# Patient Record
Sex: Female | Born: 1945 | Race: White | Hispanic: No | Marital: Married | Smoking: Former smoker
Health system: Southern US, Community
[De-identification: ages and names within clinical notes are randomized; demographics above are authoritative.]

## PROBLEM LIST (undated history)

## (undated) DIAGNOSIS — I10 Essential (primary) hypertension: Secondary | ICD-10-CM

## (undated) DIAGNOSIS — G309 Alzheimer's disease, unspecified: Secondary | ICD-10-CM

## (undated) DIAGNOSIS — J45909 Unspecified asthma, uncomplicated: Secondary | ICD-10-CM

## (undated) DIAGNOSIS — F0280 Dementia in other diseases classified elsewhere without behavioral disturbance: Secondary | ICD-10-CM

## (undated) DIAGNOSIS — F419 Anxiety disorder, unspecified: Secondary | ICD-10-CM

## (undated) HISTORY — PX: NASAL SINUS SURGERY: SHX719

## (undated) HISTORY — PX: APPENDECTOMY: SHX54

---

## 2015-11-08 ENCOUNTER — Encounter: Payer: Self-pay | Admitting: Emergency Medicine

## 2015-11-08 ENCOUNTER — Emergency Department: Payer: Medicare Other

## 2015-11-08 ENCOUNTER — Emergency Department
Admission: EM | Admit: 2015-11-08 | Discharge: 2015-11-08 | Disposition: A | Discharge status: Home / Self Care | Payer: Medicare Other | Attending: Emergency Medicine | Admitting: Emergency Medicine

## 2015-11-08 DIAGNOSIS — S80212A Abrasion, left knee, initial encounter: Secondary | ICD-10-CM | POA: Diagnosis not present

## 2015-11-08 DIAGNOSIS — T07XXXA Unspecified multiple injuries, initial encounter: Secondary | ICD-10-CM

## 2015-11-08 DIAGNOSIS — Y939 Activity, unspecified: Secondary | ICD-10-CM | POA: Diagnosis not present

## 2015-11-08 DIAGNOSIS — S62617A Displaced fracture of proximal phalanx of left little finger, initial encounter for closed fracture: Secondary | ICD-10-CM | POA: Insufficient documentation

## 2015-11-08 DIAGNOSIS — Z87891 Personal history of nicotine dependence: Secondary | ICD-10-CM | POA: Insufficient documentation

## 2015-11-08 DIAGNOSIS — S00212A Abrasion of left eyelid and periocular area, initial encounter: Secondary | ICD-10-CM | POA: Diagnosis not present

## 2015-11-08 DIAGNOSIS — S80211A Abrasion, right knee, initial encounter: Secondary | ICD-10-CM | POA: Insufficient documentation

## 2015-11-08 DIAGNOSIS — Y929 Unspecified place or not applicable: Secondary | ICD-10-CM | POA: Insufficient documentation

## 2015-11-08 DIAGNOSIS — G309 Alzheimer's disease, unspecified: Secondary | ICD-10-CM | POA: Insufficient documentation

## 2015-11-08 DIAGNOSIS — S62609A Fracture of unspecified phalanx of unspecified finger, initial encounter for closed fracture: Secondary | ICD-10-CM

## 2015-11-08 DIAGNOSIS — W010XXA Fall on same level from slipping, tripping and stumbling without subsequent striking against object, initial encounter: Secondary | ICD-10-CM | POA: Diagnosis not present

## 2015-11-08 DIAGNOSIS — S6992XA Unspecified injury of left wrist, hand and finger(s), initial encounter: Secondary | ICD-10-CM | POA: Diagnosis present

## 2015-11-08 DIAGNOSIS — Y999 Unspecified external cause status: Secondary | ICD-10-CM | POA: Insufficient documentation

## 2015-11-08 DIAGNOSIS — W19XXXA Unspecified fall, initial encounter: Secondary | ICD-10-CM

## 2015-11-08 HISTORY — DX: Dementia in other diseases classified elsewhere, unspecified severity, without behavioral disturbance, psychotic disturbance, mood disturbance, and anxiety: F02.80

## 2015-11-08 HISTORY — DX: Alzheimer's disease, unspecified: G30.9

## 2015-11-08 MED ORDER — TETANUS-DIPHTH-ACELL PERTUSSIS 5-2.5-18.5 LF-MCG/0.5 IM SUSP
0.5000 mL | Freq: Once | INTRAMUSCULAR | Status: AC
  Administered 2015-11-08: 0.5 mL via INTRAMUSCULAR
  Filled 2015-11-08: qty 0.5

## 2015-11-08 MED ORDER — LIDOCAINE HCL (PF) 1 % IJ SOLN
INTRAMUSCULAR | Status: AC
  Filled 2015-11-08: qty 5

## 2015-11-08 MED ORDER — LIDOCAINE HCL (PF) 1 % IJ SOLN
30.0000 mL | Freq: Once | INTRAMUSCULAR | Status: DC
  Filled 2015-11-08: qty 30

## 2015-11-08 MED ORDER — ACETAMINOPHEN 500 MG PO TABS
1000.0000 mg | ORAL_TABLET | Freq: Once | ORAL | Status: AC
  Administered 2015-11-08: 1000 mg via ORAL
  Filled 2015-11-08: qty 2

## 2015-11-08 NOTE — Discharge Instructions (Signed)
Finger Fracture  Fractures of fingers are breaks in the bones of the fingers. There are many types of fractures. There are different ways of treating these fractures. Your health care provider will discuss the best way to treat your fracture.  CAUSES  Traumatic injury is the main cause of broken fingers. These include:  · Injuries while playing sports.  · Workplace injuries.  · Falls.  RISK FACTORS  Activities that can increase your risk of finger fractures include:  · Sports.  · Workplace activities that involve machinery.  · A condition called osteoporosis, which can make your bones less dense and cause them to fracture more easily.  SIGNS AND SYMPTOMS  The main symptoms of a broken finger are pain and swelling within 15 minutes after the injury. Other symptoms include:  · Bruising of your finger.  · Stiffness of your finger.  · Numbness of your finger.  · Exposed bones (compound fracture) if the fracture is severe.  DIAGNOSIS   The best way to diagnose a broken bone is with X-ray imaging. Additionally, your health care provider will use this X-ray image to evaluate the position of the broken finger bones.   TREATMENT   Finger fractures can be treated with:   · Nonreduction--This means the bones are in place. The finger is splinted without changing the positions of the bone pieces. The splint is usually left on for about a week to 10 days. This will depend on your fracture and what your health care provider thinks.  · Closed reduction--The bones are put back into position without using surgery. The finger is then splinted.  · Open reduction and internal fixation--The fracture site is opened. Then the bone pieces are fixed into place with pins or some type of hardware. This is seldom required. It depends on the severity of the fracture.  HOME CARE INSTRUCTIONS   · Follow your health care provider's instructions regarding activities, exercises, and physical therapy.  · Only take over-the-counter or prescription  medicines for pain, discomfort, or fever as directed by your health care provider.  SEEK MEDICAL CARE IF:  You have pain or swelling that limits the motion or use of your fingers.  SEEK IMMEDIATE MEDICAL CARE IF:   Your finger becomes numb.  MAKE SURE YOU:   · Understand these instructions.  · Will watch your condition.  · Will get help right away if you are not doing well or get worse.     This information is not intended to replace advice given to you by your health care provider. Make sure you discuss any questions you have with your health care provider.     Document Released: 07/24/2000 Document Revised: 01/30/2013 Document Reviewed: 11/21/2012  Elsevier Interactive Patient Education ©2016 [employer]

## 2015-11-08 NOTE — ED Notes (Signed)
Patient transported to CT 

## 2015-11-08 NOTE — ED Notes (Signed)
Wounds irrigated with NS. 

## 2015-11-08 NOTE — ED Notes (Signed)
Patient presents to the ED post fall after tripping on a sidewalk.  Patient has abrasions to her knees and deformity to her fifth finger of her left hand.  Patient has small abrasion to the left side of her forehead.  Patient denies taking blood thinners.  History of alzheimer's.

## 2015-11-08 NOTE — ED Notes (Signed)
Pt verbalized understanding of discharge instructions. NAD at this time. 

## 2015-11-08 NOTE — ED Provider Notes (Signed)
[facility] Emergency Department Provider Note  ____________________________________________  Time seen: Approximately 10:49 AM  I have reviewed the triage vital signs and the nursing notes.   HISTORY  Chief Complaint Fall   HPI Jessica Webster is a 70 y.o. female with a history of Alzheimer's who presents for evaluation status post mechanical. Patient is accompanied by her husband who was with her during this accident. She tripped on the sidewalk and fell. She sustained multiple abrasions to her knees and left upper extremity. Also has deformity and swelling on the base of her fourth digit on the left hand. She has an abrasion on her forehead. According to the husband and patient no LOC. She is not on blood thinners. She has no headache. She is complaining of pain in her left hand only. She reports the pain is constant, moderate, worse with movement, present since the accident. Patient ambulatory at the scene. She has no wrist pain. No neck pain, no back pain, no abdominal pain. She denies feeling dizzy, palpitations, chest pain, shortness of breath or leading to her fall. Unknown last tetanus shot.  Past Medical History  Diagnosis Date  . Alzheimer disease     There are no active problems to display for this patient.   History reviewed. No pertinent past surgical history.  No current outpatient prescriptions on file.  Allergies Erythromycin and Sulfa antibiotics  No family history on file.  Social History Social History  Substance Use Topics  . Smoking status: Former Games developer  . Smokeless tobacco: None  . Alcohol Use: No    Review of Systems  Constitutional: Negative for fever. Eyes: Negative for visual changes. ENT: Negative for sore throat. Cardiovascular: Negative for chest pain. Respiratory: Negative for shortness of breath. Gastrointestinal: Negative for abdominal pain, vomiting or diarrhea. Genitourinary: Negative for  dysuria. Musculoskeletal: Negative for back pain. + Left hand and L shoulder pain Skin: Negative for rash. + b/l knee abrasions and forehead abrasion Neurological: Negative for headaches, weakness or numbness.  ____________________________________________   PHYSICAL EXAM:  VITAL SIGNS: ED Triage Vitals  Enc Vitals Group     BP 11/08/15 1002 106/58 mmHg     Pulse Rate 11/08/15 1002 68     Resp 11/08/15 1002 20     Temp 11/08/15 1002 97.7 F (36.5 C)     Temp Source 11/08/15 1002 Oral     SpO2 11/08/15 1002 100 %     Weight 11/08/15 1002 140 lb (63.504 kg)     Height 11/08/15 1002  (1.676 m)     Head Cir --      Peak Flow --      Pain Score 11/08/15 1005 8     Pain Loc --      Pain Edu? --      Excl. in GC? --     Constitutional: Alert and oriented. Well appearing and in no apparent distress. HEENT:      Head: Normocephalic and atraumatic.         Eyes: Conjunctivae are normal. Sclera is non-icteric. EOMI. PERRL      Mouth/Throat: Mucous membranes are moist.       Neck: Supple with no signs of meningismus. Cardiovascular: Regular rate and rhythm. No murmurs, gallops, or rubs. 2+ symmetrical distal pulses are present in all extremities. No JVD. Respiratory: Normal respiratory effort. Lungs are clear to auscultation bilaterally. No wheezes, crackles, or rhonchi.  Gastrointestinal: Soft, non tender, and non distended with positive bowel sounds.  No rebound or guarding. Genitourinary: No CVA tenderness. Musculoskeletal: Swelling and hematoma located on the base of the fourth digit on the left hand. No snuffbox tenderness, no wrist tenderness, no pain with axial load of the thumb. Tender to palpation on the anterior aspect of the left shoulder with no obvious deformities. Full painless range of motion of all other extremities including bilateral hips Neurologic: Normal speech and language. Face is symmetric. Moving all extremities. No gross focal neurologic deficits are  appreciated. Skin: Abrasion over the left eye, multiple abrasions on left upper extremity and bilateral knees  Psychiatric: Mood and affect are normal. Speech and behavior are normal.  ____________________________________________   LABS (all labs ordered are listed, but only abnormal results are displayed)  Labs Reviewed - No data to display ____________________________________________  EKG  none  ____________________________________________  RADIOLOGY  XR hand: Moderately angulated fifth proximal phalangeal fracture  CT head/ Max face / c-spine: Negative ____________________________________________   PROCEDURES  Procedure(s) performed:yes  Reduction of fracture Date/Time: 2:14 PM Performed by: Nita Sicklearolina Girlie Veltri Authorized by: Nita Sicklearolina Heinz Eckert Consent: Verbal consent obtained. Risks and benefits: risks, benefits and alternatives were discussed Consent given by: patient Required items: required blood products, implants, devices, and special equipment available Time out: Immediately prior to procedure a "time out" was called to verify the correct patient, procedure, equipment, support staff and site/side marked as required.  Patient sedated: no  Vitals: Vital signs were monitored during sedation. Patient tolerance: Patient tolerated the procedure well with no immediate complications. Bone: Left fifth proximal phalanx Reduction technique: traction, buddy tapping and aluminum splint  Good distal capillary refill   Critical Care performed:  None ____________________________________________   INITIAL IMPRESSION / ASSESSMENT AND PLAN / ED COURSE  70 y.o. female with a history of Alzheimer's who presents for evaluation status post mechanical. Patient with obvious swelling and hematoma to the left hand. Also complaining of tenderness on the anterior aspect of the left shoulder. Also with abrasion on her forehead above her left eye. No CT and L-spine tenderness, full  painless range of motion of bilateral hips area and superficial abrasion to bilateral knees. Neurologically intact. Plan for CT head, face, C-spine, x-ray of her left hand and shoulder. Tetanus booster. Tylenol for pain.  ----------------------------------------- 2:09 PM on 11/08/2015 -----------------------------------------  Head CT, CT cervical spine and maxillofacial were no injuries. Patient was found to have a fifth proximal phalanx fracture which was reduced after a digital block and buddy tapped. Will dc with referral to ortho for f/u.   Pertinent labs & imaging results that were available during my care of the patient were reviewed by me and considered in my medical decision making (see chart for details).    ____________________________________________   FINAL CLINICAL IMPRESSION(S) / ED DIAGNOSES  Final diagnoses:  Fall, initial encounter  Fracture phalanges, hand, closed, initial encounter  Abrasions of multiple sites      NEW MEDICATIONS STARTED DURING THIS VISIT:  New Prescriptions   No medications on file     Note:  This document was prepared using Dragon voice recognition software and may include unintentional dictation errors.    Nita Sicklearolina Sister Carbone, MD 11/08/15 1414

## 2016-01-25 ENCOUNTER — Other Ambulatory Visit: Payer: Self-pay | Admitting: Adult Health

## 2016-01-25 DIAGNOSIS — R1084 Generalized abdominal pain: Secondary | ICD-10-CM

## 2016-01-28 ENCOUNTER — Ambulatory Visit
Admission: RE | Admit: 2016-01-28 | Discharge: 2016-01-28 | Disposition: A | Discharge status: Home / Self Care | Payer: Medicare Other | Source: Ambulatory Visit | Attending: Adult Health | Admitting: Adult Health

## 2016-01-28 DIAGNOSIS — R1084 Generalized abdominal pain: Secondary | ICD-10-CM

## 2016-02-03 ENCOUNTER — Ambulatory Visit: Payer: Medicare Other | Attending: Neurology

## 2016-02-03 DIAGNOSIS — M6281 Muscle weakness (generalized): Secondary | ICD-10-CM | POA: Diagnosis present

## 2016-02-03 DIAGNOSIS — R2681 Unsteadiness on feet: Secondary | ICD-10-CM | POA: Diagnosis present

## 2016-02-03 DIAGNOSIS — R601 Generalized edema: Secondary | ICD-10-CM

## 2016-02-04 NOTE — Therapy (Signed)
[facility] MAIN [facility] [address] [place], [place], [address] Phone: [phone]   Fax:  [phone]  Physical Therapy Evaluation  Patient Details  Name: Jessica Webster MRN: [id number] Date of Birth: 1945/06/24 Referring Provider: Austin Miles, PA  Encounter Date: 02/03/2016      PT End of Session - 02/03/16 1538    Visit Number 1   Number of Visits 12   Date for PT Re-Evaluation 03/16/16   Authorization Type 1 / 10 (G Code)   PT Start Time 1000   PT Stop Time 1100   PT Time Calculation (min) 60 min   Equipment Utilized During Treatment Gait belt   Activity Tolerance Patient tolerated treatment well   Behavior During Therapy [facility] for tasks assessed/performed      Past Medical History:  Diagnosis Date  . Alzheimer disease     History reviewed. No pertinent surgical history.  There were no vitals filed for this visit.       Subjective Assessment - 02/03/16 1018    Subjective Pt reports imbalance with standing activities reporting difficulty with cooking, cleaning, standing for prolonged periods of times, gardening, ambulating at home, and picking items off the floor. Patient reports she has several cockerspaniel dogs that she is afraid of tripping over. Reports she's has difficulty with raising from sitting to standing and feels weak when she's performing the motion.    Pertinent History Hx of Alzheimers; Dementia    Limitations Lifting;Standing;Walking   How long can you stand comfortably? 30 min   How long can you walk comfortably? 10 min   Patient Stated Goals To feel more balanced when performing functional activities.    Currently in Pain? No/denies            [facility] PT Assessment - 02/03/16 1010      Assessment   Medical Diagnosis gait/balance Tension Headaches/Muscle Tightness    Referring Provider Austin Miles, PA   Onset Date/Surgical Date 04/26/15   Hand Dominance Right   Next MD Visit unknown    Prior Therapy none     Precautions   Precautions Fall     Balance Screen   Has the patient fallen in the past 6 months No   Has the patient had a decrease in activity level because of a fear of falling?  Yes   Is the patient reluctant to leave their home because of a fear of falling?  No     Home Tourist information centre manager residence   Living Arrangements Spouse/significant other   Available Help at Discharge Family   Type of Home House   Home Access Stairs to enter   Entrance Stairs-Number of Steps 4   Entrance Stairs-Rails Right   Home Layout Two level   Alternate Level Stairs-Number of Steps 13   Alternate Level Stairs-Rails Right   Home Equipment [place] - single point     Prior Function   Level of Independence Independent   Vocation Retired   Gaffer N/A   Leisure Taking care of children, baby sitting, play with dogs, spending time with friends     Cognition   Overall Cognitive Status No family/caregiver present to determine baseline cognitive functioning     ROM / Strength   AROM / PROM / Strength Strength     Strength   Strength Assessment Site Hip;Knee;Ankle   Right/Left Hip Right;Left   Right Hip Flexion 4/5   Right Hip ABduction 4/5  Right Hip ADduction 4/5   Left Hip Flexion 4/5   Left Hip ABduction 4/5   Left Hip ADduction 4/5   Right/Left Knee Right;Left   Right Knee Flexion 3+/5   Right Knee Extension 4/5   Left Knee Flexion 3+/5   Left Knee Extension 4/5   Right/Left Ankle Right;Left   Right Ankle Dorsiflexion 4/5   Left Ankle Dorsiflexion 4/5     Ambulation/Gait   Gait Comments Decreased step length and narrow BOS, decreased gait speed, patient with tendency to lose balance to the right requiring frequent cueing with minA-CGA      Standardized Balance Assessment   Standardized Balance Assessment Berg Balance Test   Five times sit to stand comments  25secs   10 Meter Walk .77m/s     Berg Balance Test   Sit to  Stand Able to stand  independently using hands   Standing Unsupported Able to stand safely 2 minutes   Sitting with Back Unsupported but Feet Supported on Floor or Stool Able to sit safely and securely 2 minutes   Stand to Sit Sits safely with minimal use of hands   Transfers Able to transfer safely, minor use of hands   Standing Unsupported with Eyes Closed Able to stand 10 seconds safely   Standing Ubsupported with Feet Together Able to place feet together independently and stand 1 minute safely   From Standing, Reach Forward with Outstretched Arm Can reach forward >5 cm safely (2")   From Standing Position, Pick up Object from Floor Able to pick up shoe safely and easily   From Standing Position, Turn to Look Behind Over each Shoulder Looks behind one side only/other side shows less weight shift   Turn 360 Degrees Able to turn 360 degrees safely in 4 seconds or less   Standing Unsupported, Alternately Place Feet on Step/Stool Able to complete 4 steps without aid or supervision   Standing Unsupported, One Foot in Front Able to take small step independently and hold 30 seconds   Standing on One Leg Tries to lift leg/unable to hold 3 seconds but remains standing independently   Total Score 45     Timed Up and Go Test   Normal TUG (seconds) 15       TREATMENT: Sit to stands -- 2 x 10 with cueing for proper muscular activation and joint positioning Seated abduction -- 2 x 15 with RTB with verbal and tactile cueing for hip positioning         PT Education - 02/03/16 1537    Education provided Yes   Education Details HEP: seated hip abduction with RTB, sit to stands   Person(s) Educated Patient   Methods Explanation;Demonstration   Comprehension Verbalized understanding;Returned demonstration             PT Long Term Goals - 02/04/16 1314      PT LONG TERM GOAL #1   Title Pt will score a <16 seconds when performing the 5xSTS to demonstrate significant improvement in  functional strength and decrease fall risk   Baseline 5xSTS: 25 sec   Time 6   Period Weeks   Status New     PT LONG TERM GOAL #2   Title Patient will improve her BERG score to >52 to demonstrate significant improvement in static balance and picking items off of the floor.   Baseline BERG: 46   Time 6   Period Weeks   Status New     PT LONG TERM GOAL #  3   Title Patient will be independent with HEP to conitnue benefits of exercise after discharge from physical therapy.   Baseline Dependent for exercise progression and performance   Time 6   Status New     PT LONG TERM GOAL #4   Title Patient will score under 10 secs on the TUG to demonstrate decreased fall risk and improving ability to transfer from sitting to standing   Baseline TUG: 15 sec   Time 6   Period Weeks   Status New               Plan - 02/04/16 1248    Clinical Impression Statement Pt is a 70 yo right hand dominant female experiencing with increased fall risk secondary to deconditioning. Patient presents with decreased muscular strength, endurance, and static/dynamic balancing as indicated by decreased  BERG, 5xSTS, and TUG scores and patient will benefit from further skilled therapy aimed at improving current limitations to return to prior level of function.    Rehab Potential Fair   Clinical Impairments Affecting Rehab Potential (+) Highly motivated, Family (-) Alzheimers, age   PT Frequency 2x / week   PT Duration 6 weeks   PT Treatment/Interventions Gait training;ADLs/Self Care Home Management;Moist Heat;Balance training;Therapeutic exercise;Therapeutic activities;Neuromuscular re-education;Patient/family education;Passive range of motion   PT Next Visit Plan Progress functional strengthening and balance   PT Home Exercise Plan Seated hip abduction, sit to stands   Consulted and Agree with Plan of Care Patient      Patient will benefit from skilled therapeutic intervention in order to improve the  following deficits and impairments:  Abnormal gait, Impaired sensation, Decreased strength, Decreased endurance, Decreased safety awareness, Decreased balance, Decreased coordination, Postural dysfunction, Increased muscle spasms  Visit Diagnosis: Generalized edema  Unsteadiness on feet      G-Codes - 02/04/16 1258    Functional Assessment Tool Used 5XSTS, BERG, TUG, 6751m walk test, clinical judgement   Functional Limitation Mobility: Walking and moving around   Mobility: Walking and Moving Around Current Status [phone]([id number]) At least 20 percent but less than 40 percent impaired, limited or restricted   Mobility: Walking and Moving Around Goal Status [phone]([id number]) At least 1 percent but less than 20 percent impaired, limited or restricted       Problem List There are no active problems to display for this patient.   Myrene GalasWesley Rosmary Dionisio, PT DPT 02/04/2016, 1:37 PM  [facility] [address] [place], [place], [address] Phone: [phone]   Fax:  [phone]  Name: Jessica NephewLinda Webster MRN: [id number] Date of Birth: [birth year]

## 2016-02-09 ENCOUNTER — Ambulatory Visit: Payer: Medicare Other

## 2016-02-09 DIAGNOSIS — R2681 Unsteadiness on feet: Secondary | ICD-10-CM

## 2016-02-09 DIAGNOSIS — M6281 Muscle weakness (generalized): Secondary | ICD-10-CM

## 2016-02-09 DIAGNOSIS — R601 Generalized edema: Secondary | ICD-10-CM | POA: Diagnosis not present

## 2016-02-09 NOTE — Therapy (Signed)
[facility] MAIN [facility] SERVICES [address] [place], [place], [address] Phone: [phone]   Fax:  [phone]  Physical Therapy Treatment  Patient Details  Name: Jessica Webster MRN: [id number] Date of Birth: 04-26-1945 Referring Provider: Austin Miles, PA  Encounter Date: 02/09/2016      PT End of Session - 02/09/16 1711    Visit Number 2   Number of Visits 12   Date for PT Re-Evaluation 03/16/16   Authorization Type 2 / 10 (G Code)   PT Start Time 1515   PT Stop Time 1600   PT Time Calculation (min) 45 min   Equipment Utilized During Treatment Gait belt   Activity Tolerance Patient tolerated treatment well   Behavior During Therapy [facility] for tasks assessed/performed      Past Medical History:  Diagnosis Date  . Alzheimer disease     History reviewed. No pertinent surgical history.  There were no vitals filed for this visit.      Subjective Assessment - 02/09/16 1520    Subjective Pt reports no falls in the past week and reports she's been performing exercises at home to stay fit.    Pertinent History Hx of Alzheimers; Dementia    Limitations Lifting;Standing;Walking   How long can you stand comfortably? 30 min   How long can you walk comfortably? 10 min   Patient Stated Goals To feel more balanced when performing functional activities.    Currently in Pain? No/denies      TREATMENT Therapeutic Exercise: Sit to stand - 3 x 10 with cueing on hip and knee positioning during exercise Ball Squeeze/Glute squeeze - 3 x15 with cueing on muscular activation Seated clam shells GTB - 2x 15 with cueing on hip position Seated marches with GTB around knees - 2 x 15 Feet together EO, EC - 2 x 30 secs on blue airex pad  Tandem walking in hallway - 2 x 79ft  Side stepping in hallway - 2 x 16ft Step ups onto stairs - x10 with max cueing on foot placement. Feet together EO with horizontal head/body turns, Vertical head/body turns - x  15 each side         PT Education - 02/09/16 1710    Education provided Yes   Education Details Educated on form with sit to stands   Person(s) Educated Patient   Methods Explanation;Demonstration   Comprehension Verbalized understanding;Returned demonstration             PT Long Term Goals - 02/04/16 1314      PT LONG TERM GOAL #1   Title Pt will score a <16 seconds when performing the 5xSTS to demonstrate significant improvement in functional strength and decrease fall risk   Baseline 5xSTS: 25 sec   Time 6   Period Weeks   Status New     PT LONG TERM GOAL #2   Title Patient will improve her BERG score to >52 to demonstrate significant improvement in static balance and picking items off of the floor.   Baseline BERG: 46   Time 6   Period Weeks   Status New     PT LONG TERM GOAL #3   Title Patient will be independent with HEP to conitnue benefits of exercise after discharge from physical therapy.   Baseline Dependent for exercise progression and performance   Time 6   Status New     PT LONG TERM GOAL #4   Title Patient will score under  10 secs on the TUG to demonstrate decreased fall risk and improving ability to transfer from sitting to standing   Baseline TUG: 15 sec   Time 6   Period Weeks   Status New               Plan - 02/09/16 1712    Clinical Impression Statement Pt demonstrates frequent confusion throughout session requiring constant cueing on LE placement and technique with exercise. Patient demonstrates decreased strength and coordination with exercise and will benefit from further skilled therapy to return to prior level of function.    Rehab Potential Fair   Clinical Impairments Affecting Rehab Potential (+) Highly motivated, Family (-) Alzheimers, age   PT Frequency 2x / week   PT Duration 6 weeks   PT Treatment/Interventions Gait training;ADLs/Self Care Home Management;Moist Heat;Balance training;Therapeutic exercise;Therapeutic  activities;Neuromuscular re-education;Patient/family education;Passive range of motion   PT Next Visit Plan Progress functional strengthening and balance   PT Home Exercise Plan Seated hip abduction, sit to stands   Consulted and Agree with Plan of Care Patient      Patient will benefit from skilled therapeutic intervention in order to improve the following deficits and impairments:  Abnormal gait, Impaired sensation, Decreased strength, Decreased endurance, Decreased safety awareness, Decreased balance, Decreased coordination, Postural dysfunction, Increased muscle spasms  Visit Diagnosis: Unsteadiness on feet  Muscle weakness (generalized)     Problem List There are no active problems to display for this patient.   Myrene GalasWesley Dellene Mcgroarty, PT DPT 02/09/2016, 5:15 PM  [place] [facility] [address] [place], [place], [address] Phone: [phone]   Fax:  [phone]  Name: Jessica Webster MRN: [id number] Date of Birth: 1945-05-19

## 2016-02-11 ENCOUNTER — Ambulatory Visit: Payer: Medicare Other

## 2016-02-11 DIAGNOSIS — R601 Generalized edema: Secondary | ICD-10-CM | POA: Diagnosis not present

## 2016-02-11 DIAGNOSIS — R2681 Unsteadiness on feet: Secondary | ICD-10-CM

## 2016-02-11 DIAGNOSIS — M6281 Muscle weakness (generalized): Secondary | ICD-10-CM

## 2016-02-11 NOTE — Therapy (Signed)
[facility] [address] [place], [place], [address] Phone: [phone]   Fax:  [phone]  Physical Therapy Treatment  Patient Details  Name: Jessica NephewLinda Webster MRN: [id number] Date of Birth: 1946-02-08 Referring Provider: Austin Milesynthia Lynn Dunn, PA  Encounter Date: 02/11/2016      PT End of Session - 02/11/16 1321    Visit Number 3   Number of Visits 12   Date for PT Re-Evaluation 03/16/16   Authorization Type 2 / 10 (G Code)   PT Start Time 1301   PT Stop Time 1345   PT Time Calculation (min) 44 min   Equipment Utilized During Treatment Gait belt   Activity Tolerance Patient tolerated treatment well   Behavior During Therapy [facility] for tasks assessed/performed      Past Medical History:  Diagnosis Date  . Alzheimer disease     History reviewed. No pertinent surgical history.  There were no vitals filed for this visit.      Subjective Assessment - 02/11/16 1304    Subjective Pt reports no fall since the previous visit. Patient feels she is doing well and states her balance still is giving her a little difficulty.   Pertinent History Hx of Alzheimers; Dementia    Limitations Lifting;Standing;Walking   How long can you stand comfortably? 30 min   How long can you walk comfortably? 10 min   Patient Stated Goals To feel more balanced when performing functional activities.    Currently in Pain? No/denies        TREATMENT Therapeutic Exercise: Sit to stand - 3 x 15 with cueing on hip and knee positioning during exercise Ball Squeeze/Glute squeeze - 3 x15 with cueing on muscular activation Seated clam shells GTB - 2x 15 with cueing on hip position Seated marches with GTB around knees - 2 x 15 Stand hip abduction with UE support - x 20 Standing hip extension with UE support - x 20  Leg Press machine with cueing on speed and control - 105# 2 x 20  Feet together EO, EC - 2 x 30 secs on blue airex pad  Feet together EO  with horizontal head/body turns, Vertical head/body turns on airex- x 15 each side       PT Education - 02/11/16 1317    Education provided Yes   Education Details Educated on form/technique with exercise   Person(s) Educated Patient   Methods Explanation;Demonstration   Comprehension Verbalized understanding;Returned demonstration             PT Long Term Goals - 02/04/16 1314      PT LONG TERM GOAL #1   Title Pt will score a <16 seconds when performing the 5xSTS to demonstrate significant improvement in functional strength and decrease fall risk   Baseline 5xSTS: 25 sec   Time 6   Period Weeks   Status New     PT LONG TERM GOAL #2   Title Patient will improve her BERG score to >52 to demonstrate significant improvement in static balance and picking items off of the floor.   Baseline BERG: 46   Time 6   Period Weeks   Status New     PT LONG TERM GOAL #3   Title Patient will be independent with HEP to conitnue benefits of exercise after discharge from physical therapy.   Baseline Dependent for exercise progression and performance   Time 6   Status New     PT LONG TERM  GOAL #4   Title Patient will score under 10 secs on the TUG to demonstrate decreased fall risk and improving ability to transfer from sitting to standing   Baseline TUG: 15 sec   Time 6   Period Weeks   Status New               Plan - 02/11/16 1321    Clinical Impression Statement Pt demonstrates frequent confusion throughout session requiring frequent cueing. Focused on performing standing hip and LE strengthening today as patient continues to demonstrate weakness and will benefit from further skilled therapy to return to prior level of function.    Rehab Potential Fair   Clinical Impairments Affecting Rehab Potential (+) Highly motivated, Family (-) Alzheimers, age   PT Frequency 2x / week   PT Duration 6 weeks   PT Treatment/Interventions Gait training;ADLs/Self Care Home  Management;Moist Heat;Balance training;Therapeutic exercise;Therapeutic activities;Neuromuscular re-education;Patient/family education;Passive range of motion   PT Next Visit Plan Progress functional strengthening and balance   PT Home Exercise Plan Seated hip abduction, sit to stands   Consulted and Agree with Plan of Care Patient      Patient will benefit from skilled therapeutic intervention in order to improve the following deficits and impairments:  Abnormal gait, Impaired sensation, Decreased strength, Decreased endurance, Decreased safety awareness, Decreased balance, Decreased coordination, Postural dysfunction, Increased muscle spasms  Visit Diagnosis: Unsteadiness on feet  Muscle weakness (generalized)     Problem List There are no active problems to display for this patient.   Myrene Galas, PT DPT 02/11/2016, 1:57 PM  [place] [facility] MAIN [facility] SERVICES [address] [place], [place], [address] Phone: [phone]   Fax:  [phone]  Name: Jessica Webster MRN: [id number] Date of Birth: [birth year]

## 2016-02-16 ENCOUNTER — Ambulatory Visit: Payer: Medicare Other

## 2016-02-16 DIAGNOSIS — M6281 Muscle weakness (generalized): Secondary | ICD-10-CM

## 2016-02-16 DIAGNOSIS — R601 Generalized edema: Secondary | ICD-10-CM

## 2016-02-16 DIAGNOSIS — R2681 Unsteadiness on feet: Secondary | ICD-10-CM

## 2016-02-16 NOTE — Therapy (Signed)
[place] [facility] [address] [place], [place], [address] Phone: [phone]   Fax:  [phone]  Physical Therapy Treatment  Patient Details  Name: Jessica Webster MRN: [id number] Date of Birth: [birth year] Referring Provider: Austin Milesynthia Lynn Dunn, PA  Encounter Date: 02/16/2016      PT End of Session - 02/16/16 1620    Visit Number 4   Number of Visits 12   Date for PT Re-Evaluation 03/16/16   Authorization Type 4 / 10 (G Code)   PT Start Time 1530   PT Stop Time 1615   PT Time Calculation (min) 45 min   Equipment Utilized During Treatment Gait belt   Activity Tolerance Patient tolerated treatment well   Behavior During Therapy [facility] for tasks assessed/performed      Past Medical History:  Diagnosis Date  . Alzheimer disease     History reviewed. No pertinent surgical history.  There were no vitals filed for this visit.      Subjective Assessment - 02/16/16 1531    Subjective Pt reports no falls since the previous visit and states she is not afriad of falling.    Pertinent History Hx of Alzheimers; Dementia    Limitations Lifting;Standing;Walking   How long can you stand comfortably? 30 min   How long can you walk comfortably? 10 min   Patient Stated Goals To feel more balanced when performing functional activities.    Currently in Pain? No/denies        TREATMENT Therapeutic Exercise: Sit to stand - 3 x 15 with cueing on hip and knee positioning during exercise Ball Squeeze/Glute squeeze - 2 x15 with cueing on muscular activation Seated clam shells GTB - 3 x 20 with cueing on hip position Seated marches with GTB around knees - 2 x 15 Stand hip abduction with UE support - x 20, x13  Standing hip extension with UE support - 2 x 20  Leg Press machine with cueing on speed and control - 105# 2 x 20, x10  Feet together EO with horizontal head/body turns, Vertical head/body turns on airex- x 15 each side Step ups  onto 8" step - x20 with constant cueing on technique        PT Education - 02/16/16 1619    Education provided Yes   Education Details Educated on [employer] Rxform/technique   Person(s) Educated Patient   Methods Explanation;Demonstration   Comprehension Verbalized understanding;Returned demonstration             PT Long Term Goals - 02/04/16 1314      PT LONG TERM GOAL #1   Title Pt will score a <16 seconds when performing the 5xSTS to demonstrate significant improvement in functional strength and decrease fall risk   Baseline 5xSTS: 25 sec   Time 6   Period Weeks   Status New     PT LONG TERM GOAL #2   Title Patient will improve her BERG score to >52 to demonstrate significant improvement in static balance and picking items off of the floor.   Baseline BERG: 46   Time 6   Period Weeks   Status New     PT LONG TERM GOAL #3   Title Patient will be independent with HEP to conitnue benefits of exercise after discharge from physical therapy.   Baseline Dependent for exercise progression and performance   Time 6   Status New     PT LONG TERM GOAL #4   Title Patient  will score under 10 secs on the TUG to demonstrate decreased fall risk and improving ability to transfer from sitting to standing   Baseline TUG: 15 sec   Time 6   Period Weeks   Status New               Plan - 02/16/16 1620    Clinical Impression Statement Pt demonstrates frequent confusion and requires frequent cueing throughout session. Conitnued to focus on LE strengthening and improving endurance as patient continues to demonstrate weakness transfering from sitting to standing. Patient will benefit from further skilled therapy to return to prior level of function.    Rehab Potential Fair   Clinical Impairments Affecting Rehab Potential (+) Highly motivated, Family (-) Alzheimers, age   PT Frequency 2x / week   PT Duration 6 weeks   PT Treatment/Interventions Gait training;ADLs/Self Care Home  Management;Moist Heat;Balance training;Therapeutic exercise;Therapeutic activities;Neuromuscular re-education;Patient/family education;Passive range of motion   PT Next Visit Plan Progress functional strengthening and balance   PT Home Exercise Plan Seated hip abduction, sit to stands   Consulted and Agree with Plan of Care Patient      Patient will benefit from skilled therapeutic intervention in order to improve the following deficits and impairments:  Abnormal gait, Impaired sensation, Decreased strength, Decreased endurance, Decreased safety awareness, Decreased balance, Decreased coordination, Postural dysfunction, Increased muscle spasms  Visit Diagnosis: Unsteadiness on feet  Muscle weakness (generalized)  Generalized edema     Problem List There are no active problems to display for this patient.   Jessica Webster, PT DPT 02/16/2016, 4:29 PM  [place] [facility] MAIN [facility] SERVICES [address] [place], [place], [address] Phone: [phone]   Fax:  [phone]  Name: Jessica Webster MRN: [id number] Date of Birth: August 06, 1945

## 2016-02-18 ENCOUNTER — Ambulatory Visit: Payer: Medicare Other

## 2016-02-18 VITALS — BP 95/84 | HR 89

## 2016-02-18 DIAGNOSIS — R2681 Unsteadiness on feet: Secondary | ICD-10-CM

## 2016-02-18 DIAGNOSIS — R601 Generalized edema: Secondary | ICD-10-CM | POA: Diagnosis not present

## 2016-02-18 DIAGNOSIS — M6281 Muscle weakness (generalized): Secondary | ICD-10-CM

## 2016-02-18 NOTE — Therapy (Signed)
[facility] [address] [place], [place], [address] Phone: [phone]   Fax:  [phone]  Physical Therapy Treatment  Patient Details  Name: Jessica NephewLinda Webster MRN: [id number] Date of Birth: [birth year] Referring Provider: Austin Milesynthia Lynn Dunn, PA  Encounter Date: 02/18/2016      PT End of Session - 02/18/16 1503    Visit Number 5   Number of Visits 12   Date for PT Re-Evaluation 03/16/16   Authorization Type 5 / 10 (G Code)   PT Start Time 1430   PT Stop Time 1515   PT Time Calculation (min) 45 min   Equipment Utilized During Treatment Gait belt   Activity Tolerance Patient tolerated treatment well   Behavior During Therapy [facility] for tasks assessed/performed      Past Medical History:  Diagnosis Date  . Alzheimer disease     History reviewed. No pertinent surgical history.  Vitals:   02/18/16 1434  BP: 95/84  Pulse: 89  SpO2: 100%        Subjective Assessment - 02/18/16 1434    Subjective Pt reports she is feeling "pretty good" today. Denies pain. No specific questions or concerns today.    Pertinent History Hx of Alzheimers; Dementia    Limitations Lifting;Standing;Walking   How long can you stand comfortably? 30 min   How long can you walk comfortably? 10 min   Patient Stated Goals To feel more balanced when performing functional activities.    Currently in Pain? No/denies        TREATMENT  Therapeutic Exercise: Sit to stand x 15,  Sit to stand with Airex under feet x 15; Resisted side stepping with red theraband around ankles x 6 lengths; Forward BOSU lunges alternating LE forward x 10 each; BOSU squats x 10 without UE support, round side up; Lateral BOSU lunges without UE support x 10 each side; Quantum bilateral leg press with cueing on speed and control 105# x 10, 120# x 10, 135# x 10; Seated clam shells GTB 3 x 20 with cueing on hip position; Seated marches with GTB around knees 3 x  20;                         PT Education - 02/18/16 1503    Education provided Yes   Education Details Education about form/technique with exercises today during session   Person(s) Educated Patient   Methods Explanation   Comprehension Verbalized understanding             PT Long Term Goals - 02/04/16 1314      PT LONG TERM GOAL #1   Title Pt will score a <16 seconds when performing the 5xSTS to demonstrate significant improvement in functional strength and decrease fall risk   Baseline 5xSTS: 25 sec   Time 6   Period Weeks   Status New     PT LONG TERM GOAL #2   Title Patient will improve her BERG score to >52 to demonstrate significant improvement in static balance and picking items off of the floor.   Baseline BERG: 46   Time 6   Period Weeks   Status New     PT LONG TERM GOAL #3   Title Patient will be independent with HEP to conitnue benefits of exercise after discharge from physical therapy.   Baseline Dependent for exercise progression and performance   Time 6   Status New  PT LONG TERM GOAL #4   Title Patient will score under 10 secs on the TUG to demonstrate decreased fall risk and improving ability to transfer from sitting to standing   Baseline TUG: 15 sec   Time 6   Period Weeks   Status New               Plan - 02/18/16 1503    Clinical Impression Statement Pt requiring constant redirection during session as well as heavy verbal/tactile cues for exercise technique. She requires hand over hand assistance at times to correctly perform exercises. Pt demonstrates good tolerance for exercise with minimal fatigue. Pt encouraged to follow-up as scheduled   Rehab Potential Fair   Clinical Impairments Affecting Rehab Potential (+) Highly motivated, Family (-) Alzheimers, age   PT Frequency 2x / week   PT Duration 6 weeks   PT Treatment/Interventions Gait training;ADLs/Self Care Home Management;Moist Heat;Balance  training;Therapeutic exercise;Therapeutic activities;Neuromuscular re-education;Patient/family education;Passive range of motion   PT Next Visit Plan Progress functional strengthening and balance   PT Home Exercise Plan Seated hip abduction, sit to stands   Consulted and Agree with Plan of Care Patient      Patient will benefit from skilled therapeutic intervention in order to improve the following deficits and impairments:  Abnormal gait, Impaired sensation, Decreased strength, Decreased endurance, Decreased safety awareness, Decreased balance, Decreased coordination, Postural dysfunction, Increased muscle spasms  Visit Diagnosis: Unsteadiness on feet  Muscle weakness (generalized)     Problem List There are no active problems to display for this patient.  Lynnea Maizes PT, DPT   Huprich,Jason 02/19/2016, 11:40 AM   [facility] MAIN [facility] SERVICES [address] [place], [place], [address] Phone: [phone]   Fax:  [phone]  Name: Jessica Webster MRN: [id number] Date of Birth: [birth year]

## 2016-02-25 ENCOUNTER — Ambulatory Visit: Payer: Medicare Other | Attending: Neurology

## 2016-02-25 VITALS — BP 113/77

## 2016-02-25 DIAGNOSIS — R2681 Unsteadiness on feet: Secondary | ICD-10-CM | POA: Insufficient documentation

## 2016-02-25 DIAGNOSIS — M6281 Muscle weakness (generalized): Secondary | ICD-10-CM

## 2016-02-25 NOTE — Therapy (Signed)
[place] [facility] [address] [place], [place], [address] Phone: [phone]   Fax:  [phone]  Physical Therapy Treatment  Patient Details  Name: Jessica NephewLinda Webster MRN: [id number] Date of Birth: 08-09-1945 Referring Provider: Austin Milesynthia Lynn Dunn, PA  Encounter Date: 02/25/2016      PT End of Session - 02/25/16 1654    Visit Number 6   Number of Visits 12   Date for PT Re-Evaluation 03/16/16   Authorization Type 5 / 10 (G Code)   PT Start Time 1605   PT Stop Time 1645   PT Time Calculation (min) 40 min   Equipment Utilized During Treatment Gait belt   Activity Tolerance Patient tolerated treatment well   Behavior During Therapy [facility] for tasks assessed/performed      Past Medical History:  Diagnosis Date  . Alzheimer disease     History reviewed. No pertinent surgical history.  Vitals:   02/25/16 1610  BP: 113/77        Subjective Assessment - 02/25/16 1607    Subjective Patient reports she's feeling good and states no pain or any recent falls since the previous visit.    Pertinent History Hx of Alzheimers; Dementia    Limitations Lifting;Standing;Walking   How long can you stand comfortably? 30 min   How long can you walk comfortably? 10 min   Patient Stated Goals To feel more balanced when performing functional activities.       TREATMENT   Therapeutic Exercise: Sit to stand with Airex under feet - 2 x 15; Seated clam shells GTB 2 x 20 with cueing on hip position; Seated marches with GTB around knees 2 x 15; Resisted side stepping with green theraband around ankles 4 x 6030ft lengths; Quantum bilateral leg press with cueing on speed and control  135# 3 x 15; Side stepping up and over 8" step - x 10         PT Education - 02/25/16 1653    Education provided Yes   Education Details Gave constant cueing on form/technique throughout exercise performance   Person(s) Educated Patient   Methods Explanation   Comprehension Verbalized understanding             PT Long Term Goals - 02/04/16 1314      PT LONG TERM GOAL #1   Title Pt will score a <16 seconds when performing the 5xSTS to demonstrate significant improvement in functional strength and decrease fall risk   Baseline 5xSTS: 25 sec   Time 6   Period Weeks   Status New     PT LONG TERM GOAL #2   Title Patient will improve her BERG score to >52 to demonstrate significant improvement in static balance and picking items off of the floor.   Baseline BERG: 46   Time 6   Period Weeks   Status New     PT LONG TERM GOAL #3   Title Patient will be independent with HEP to conitnue benefits of exercise after discharge from physical therapy.   Baseline Dependent for exercise progression and performance   Time 6   Status New     PT LONG TERM GOAL #4   Title Patient will score under 10 secs on the TUG to demonstrate decreased fall risk and improving ability to transfer from sitting to standing   Baseline TUG: 15 sec   Time 6   Period Weeks   Status New  Plan - 02/25/16 1655    Clinical Impression Statement Pt required frequent cueing and redirection during session requiring constrant verbal redirection throuhgout exercise session. Patient demonstrated decreased balance and postural sway during single leg stance and will benefit from further skilled therapy to return to prior level of function.    Rehab Potential Fair   Clinical Impairments Affecting Rehab Potential (+) Highly motivated, Family (-) Alzheimers, age   PT Frequency 2x / week   PT Duration 6 weeks   PT Treatment/Interventions Gait training;ADLs/Self Care Home Management;Moist Heat;Balance training;Therapeutic exercise;Therapeutic activities;Neuromuscular re-education;Patient/family education;Passive range of motion   PT Next Visit Plan Progress functional strengthening and balance   PT Home Exercise Plan Seated hip abduction, sit to stands    Consulted and Agree with Plan of Care Patient      Patient will benefit from skilled therapeutic intervention in order to improve the following deficits and impairments:  Abnormal gait, Impaired sensation, Decreased strength, Decreased endurance, Decreased safety awareness, Decreased balance, Decreased coordination, Postural dysfunction, Increased muscle spasms  Visit Diagnosis: Unsteadiness on feet  Muscle weakness (generalized)     Problem List There are no active problems to display for this patient.   Jessica Webster, PT DPT 02/25/2016, 4:59 PM  [place] [facility] [address] [place], [place], [address] Phone: [phone]   Fax:  [phone]  Name: Jessica NephewLinda Webster MRN: [id number] Date of Birth: [birth year]

## 2016-03-01 ENCOUNTER — Ambulatory Visit: Payer: Medicare Other

## 2016-03-01 DIAGNOSIS — M6281 Muscle weakness (generalized): Secondary | ICD-10-CM

## 2016-03-01 DIAGNOSIS — R2681 Unsteadiness on feet: Secondary | ICD-10-CM | POA: Diagnosis not present

## 2016-03-01 NOTE — Therapy (Signed)
[place] [facility] [address] [place], [place], [address] Phone: [phone]   Fax:  [phone]  Physical Therapy Treatment  Patient Details  Name: Jessica NephewLinda Webster MRN: [id number] Date of Birth: 11-01-1945 Referring Provider: Austin Milesynthia Lynn Dunn, PA  Encounter Date: 03/01/2016      PT End of Session - 03/01/16 1349    Visit Number 7   Number of Visits 12   Date for PT Re-Evaluation 03/16/16   Authorization Type 7 / 10 (G Code)   PT Start Time 1301   PT Stop Time 1347   PT Time Calculation (min) 46 min   Equipment Utilized During Treatment Gait belt   Activity Tolerance Patient tolerated treatment well   Behavior During Therapy [facility] for tasks assessed/performed      Past Medical History:  Diagnosis Date  . Alzheimer disease     History reviewed. No pertinent surgical history.  There were no vitals filed for this visit.      Subjective Assessment - 03/01/16 1348    Subjective Patient reports increased fatigue in her legs today and states she's had a cough. Patient reports she feels good otherwise.   Pertinent History Hx of Alzheimers; Dementia    Limitations Lifting;Standing;Walking   How long can you stand comfortably? 30 min   How long can you walk comfortably? 10 min   Patient Stated Goals To feel more balanced when performing functional activities.    Currently in Pain? No/denies      TREATMENT   Therapeutic Exercise: Sit to stand with Airex under feet - 3 x 10; Seated ball squeeze/ glute squeeze - 3 x 20  Airex beam forward tandem amb - x4 down and back; backward amb - x 3 down and back Side stepping up and over 8" step - x 12 Quantum bilateral leg press with cueing on speed and control  135# 3 x 15; Bosu taps from purple airex pad - x 20 B        PT Education - 03/01/16 1348    Education provided Yes   Education Details Frequent cueing/instruction for exercise performance and technique   Person(s)  Educated Patient   Methods Explanation;Demonstration   Comprehension Verbalized understanding;Returned demonstration             PT Long Term Goals - 02/04/16 1314      PT LONG TERM GOAL #1   Title Pt will score a <16 seconds when performing the 5xSTS to demonstrate significant improvement in functional strength and decrease fall risk   Baseline 5xSTS: 25 sec   Time 6   Period Weeks   Status New     PT LONG TERM GOAL #2   Title Patient will improve her BERG score to >52 to demonstrate significant improvement in static balance and picking items off of the floor.   Baseline BERG: 46   Time 6   Period Weeks   Status New     PT LONG TERM GOAL #3   Title Patient will be independent with HEP to conitnue benefits of exercise after discharge from physical therapy.   Baseline Dependent for exercise progression and performance   Time 6   Status New     PT LONG TERM GOAL #4   Title Patient will score under 10 secs on the TUG to demonstrate decreased fall risk and improving ability to transfer from sitting to standing   Baseline TUG: 15 sec   Time 6   Period  Weeks   Status New               Plan - 03/01/16 1350    Clinical Impression Statement Pt continues to require frequent cueing throughout treatment session to stay on task. Patient demonstrates improved single leg balance compared to previous visits indicating functional carryover. Patient will benefit from further skilled therapy to decrease fall risk.    Rehab Potential Fair   Clinical Impairments Affecting Rehab Potential (+) Highly motivated, Family (-) Alzheimers, age   PT Frequency 2x / week   PT Duration 6 weeks   PT Treatment/Interventions Gait training;ADLs/Self Care Home Management;Moist Heat;Balance training;Therapeutic exercise;Therapeutic activities;Neuromuscular re-education;Patient/family education;Passive range of motion   PT Next Visit Plan Progress functional strengthening and balance   PT Home  Exercise Plan Seated hip abduction, sit to stands   Consulted and Agree with Plan of Care Patient      Patient will benefit from skilled therapeutic intervention in order to improve the following deficits and impairments:  Abnormal gait, Impaired sensation, Decreased strength, Decreased endurance, Decreased safety awareness, Decreased balance, Decreased coordination, Postural dysfunction, Increased muscle spasms  Visit Diagnosis: Unsteadiness on feet  Muscle weakness (generalized)     Problem List There are no active problems to display for this patient.   Myrene GalasWesley Eban Weick, PT DPT 03/01/2016, 5:29 PM  [facility] [facility] [address] [place], [place], [address] Phone: [phone]   Fax:  [phone]  Name: Jessica NephewLinda Webster MRN: [id number] Date of Birth: Nov 10, 1945

## 2016-03-03 ENCOUNTER — Ambulatory Visit: Payer: Medicare Other

## 2016-03-03 DIAGNOSIS — R2681 Unsteadiness on feet: Secondary | ICD-10-CM | POA: Diagnosis not present

## 2016-03-03 DIAGNOSIS — M6281 Muscle weakness (generalized): Secondary | ICD-10-CM

## 2016-03-03 NOTE — Therapy (Signed)
[place] [facility] [address] [place], [place], [address] Phone: [phone]   Fax:  [phone]  Physical Therapy Treatment  Patient Details  Name: Jessica NephewLinda Webster MRN: [id number] Date of Birth: 09-20-1945 Referring Provider: Austin Milesynthia Lynn Dunn, PA  Encounter Date: 03/03/2016      PT End of Session - 03/03/16 1316    Visit Number 8   Number of Visits 12   Date for PT Re-Evaluation 03/16/16   Authorization Type 8 / 10 (G Code)   PT Start Time 1300   PT Stop Time 1343   PT Time Calculation (min) 43 min   Equipment Utilized During Treatment Gait belt   Activity Tolerance Patient tolerated treatment well   Behavior During Therapy [facility] for tasks assessed/performed      Past Medical History:  Diagnosis Date  . Alzheimer disease     History reviewed. No pertinent surgical history.  There were no vitals filed for this visit.      Subjective Assessment - 03/03/16 1303    Subjective Pt reports she continues to have a cough today.   Pt reports that her legs are feeling good today.     Pertinent History Hx of Alzheimers; Dementia    Limitations Lifting;Standing;Walking   How long can you stand comfortably? 30 min   How long can you walk comfortably? 10 min   Patient Stated Goals To feel more balanced when performing functional activities.    Currently in Pain? No/denies      Treatment Sit to stands with yellow weighted ball, 2 sets x 10, min VCs for cueing to count along, min VCs to squeeze gluts to stand  Sidestepping with red theraband resistance x 2 laps one lap each direction, CGA for stability, min VCs to take larger lateral steps  Seated abduction with red theraband resistance, 2 sets x 20 reps, min VCs for increased ROM  Heel raises on blue half bolster, 2 sets x 10 reps, 2HHA for security, min VCs to count out loud and for technique  Bosu lateral step ups, 2 sets x 10 reps alternating directions, 2 HHA, CGA for safety,  min VCs for bigger steps and proper exercise technique  Overhead press with #2 standing tandem on purple airex pad, 2 sets x 10 reps, min A for stability and initial positioning  Sidestepping x 2 laps on airex balance beam, 1 HHA, min VCs to increase step length Tandem walking forwards/backwards x 2 laps on airex balance beam, 1 HHA, min VCs to stand tall and perform with 1 HHA  Leg Press, BLEs, 2 sets x 15 reps, #130, min VCs for eccentric control                            PT Education - 03/03/16 1316    Education provided Yes   Education Details exercise technique, cueing to count repetitions    Person(s) Educated Patient   Methods Explanation;Demonstration;Verbal cues   Comprehension Verbalized understanding;Returned demonstration;Verbal cues required             PT Long Term Goals - 02/04/16 1314      PT LONG TERM GOAL #1   Title Pt will score a <16 seconds when performing the 5xSTS to demonstrate significant improvement in functional strength and decrease fall risk   Baseline 5xSTS: 25 sec   Time 6   Period Weeks   Status New     PT  LONG TERM GOAL #2   Title Patient will improve her BERG score to >52 to demonstrate significant improvement in static balance and picking items off of the floor.   Baseline BERG: 46   Time 6   Period Weeks   Status New     PT LONG TERM GOAL #3   Title Patient will be independent with HEP to conitnue benefits of exercise after discharge from physical therapy.   Baseline Dependent for exercise progression and performance   Time 6   Status New     PT LONG TERM GOAL #4   Title Patient will score under 10 secs on the TUG to demonstrate decreased fall risk and improving ability to transfer from sitting to standing   Baseline TUG: 15 sec   Time 6   Period Weeks   Status New               Plan - 03/03/16 1344    Clinical Impression Statement Pt continues to require cueing frequently to count exercises  repetitions and perform exercises with proper form. Pt was easily distraction today and required cueing to remain focused.  Initated further hip strengthening with sidestepping red theraband resistance and lateral steps over bosu ball.  She would benefit from further PT to increase dynamic balance for safer mobility.     Rehab Potential Fair   Clinical Impairments Affecting Rehab Potential (+) Highly motivated, Family (-) Alzheimers, age   PT Frequency 2x / week   PT Duration 6 weeks   PT Treatment/Interventions Gait training;ADLs/Self Care Home Management;Moist Heat;Balance training;Therapeutic exercise;Therapeutic activities;Neuromuscular re-education;Patient/family education;Passive range of motion   PT Next Visit Plan Progress functional strengthening and balance   PT Home Exercise Plan Seated hip abduction, sit to stands   Consulted and Agree with Plan of Care Patient      Patient will benefit from skilled therapeutic intervention in order to improve the following deficits and impairments:  Abnormal gait, Impaired sensation, Decreased strength, Decreased endurance, Decreased safety awareness, Decreased balance, Decreased coordination, Postural dysfunction, Increased muscle spasms  Visit Diagnosis: Muscle weakness (generalized)  Unsteadiness on feet     Problem List There are no active problems to display for this patient.   This entire session was performed under direct supervision and direction of a licensed therapist/therapist assistant . I have personally read, edited and approve of the note as written.  [name], SPT   Myrene GalasWesley Rissell, PT DPT 03/04/2016, 8:09 AM  [facility] MAIN [facility] [address] , [place], [address] Phone: [phone]   Fax:  [phone]  Name: Jessica NephewLinda Webster MRN: [id number] Date of Birth: 1945-09-28

## 2016-03-08 ENCOUNTER — Ambulatory Visit: Payer: Medicare Other

## 2016-03-08 DIAGNOSIS — R2681 Unsteadiness on feet: Secondary | ICD-10-CM

## 2016-03-08 DIAGNOSIS — M6281 Muscle weakness (generalized): Secondary | ICD-10-CM

## 2016-03-08 NOTE — Therapy (Signed)
[facility] [address] [place], [place], [address] Phone: [phone]   Fax:  [phone]  Physical Therapy Treatment  Patient Details  Name: Jessica NephewLinda Webster MRN: [id number] Date of Birth: 1946/03/24 Referring Provider: Austin Milesynthia Lynn Dunn, PA  Encounter Date: 03/08/2016      PT End of Session - 03/08/16 1348    Visit Number 9   Number of Visits 12   Date for PT Re-Evaluation 03/16/16   Authorization Type 9 / 10 (G Code)   PT Start Time 1333   PT Stop Time 1415   PT Time Calculation (min) 42 min   Equipment Utilized During Treatment Gait belt   Activity Tolerance Patient tolerated treatment well   Behavior During Therapy [facility] for tasks assessed/performed      Past Medical History:  Diagnosis Date  . Alzheimer disease     History reviewed. No pertinent surgical history.  There were no vitals filed for this visit.      Subjective Assessment - 03/08/16 1337    Subjective Pt reports she's feeling good today and does not have any pain.    Pertinent History Hx of Alzheimers; Dementia    Limitations Lifting;Standing;Walking   How long can you stand comfortably? 30 min   How long can you walk comfortably? 10 min   Patient Stated Goals To feel more balanced when performing functional activities.    Currently in Pain? No/denies        Treatment Sit to stands with yellow weighted ball , 2 sets x 10, min VCs for cueing to count along, min VCs to squeeze glutes to stand  Sidestepping with green  theraband resistance x 2 laps one lap each direction, CGA for stability, min VCs to take larger lateral steps  Seated abduction with green theraband resistance, 2 sets x 20 reps, min VCs for increased ROM  Seated ball/glute squeezes with yellow ball weighted ball - 2 x 15  Heel raises on blue half bolster, 2 sets x 20 reps, 2HHA for security, min VCs to count out loud and for technique  Bosu lateral step ups, 15 reps alternating  directions, 2 HHA, CGA for safety, min VCs for bigger steps and proper exercise technique  purple airex on tandem stance with #2  yellow ball standing tandem on purple airex pad, 30 min A for stability and initial positioning        PT Education - 03/08/16 1348    Education provided Yes   Education Details Educated on form and technique during exercise performance   Person(s) Educated Patient   Methods Explanation;Demonstration;Verbal cues   Comprehension Verbalized understanding;Returned demonstration             PT Long Term Goals - 02/04/16 1314      PT LONG TERM GOAL #1   Title Pt will score a <16 seconds when performing the 5xSTS to demonstrate significant improvement in functional strength and decrease fall risk   Baseline 5xSTS: 25 sec   Time 6   Period Weeks   Status New     PT LONG TERM GOAL #2   Title Patient will improve her BERG score to >52 to demonstrate significant improvement in static balance and picking items off of the floor.   Baseline BERG: 46   Time 6   Period Weeks   Status New     PT LONG TERM GOAL #3   Title Patient will be independent with HEP to conitnue benefits of  exercise after discharge from physical therapy.   Baseline Dependent for exercise progression and performance   Time 6   Status New     PT LONG TERM GOAL #4   Title Patient will score under 10 secs on the TUG to demonstrate decreased fall risk and improving ability to transfer from sitting to standing   Baseline TUG: 15 sec   Time 6   Period Weeks   Status New               Plan - 03/08/16 1424    Clinical Impression Statement Pt continues to require frequent cueing throughout treatment session on proper set up of exercises and limb/joint positioning throughout exercises. Patient demonstrates increased postural sway when performing airex pad activities indicating decreased static and dynamic balance and patient will benefit from further skilled therapy to decrease fall  risk.    Rehab Potential Fair   Clinical Impairments Affecting Rehab Potential (+) Highly motivated, Family (-) Alzheimers, age   PT Frequency 2x / week   PT Duration 6 weeks   PT Treatment/Interventions Gait training;ADLs/Self Care Home Management;Moist Heat;Balance training;Therapeutic exercise;Therapeutic activities;Neuromuscular re-education;Patient/family education;Passive range of motion   PT Next Visit Plan Progress functional strengthening and balance   PT Home Exercise Plan Seated hip abduction, sit to stands   Consulted and Agree with Plan of Care Patient      Patient will benefit from skilled therapeutic intervention in order to improve the following deficits and impairments:  Abnormal gait, Impaired sensation, Decreased strength, Decreased endurance, Decreased safety awareness, Decreased balance, Decreased coordination, Postural dysfunction, Increased muscle spasms  Visit Diagnosis: Muscle weakness (generalized)  Unsteadiness on feet     Problem List There are no active problems to display for this patient.   Jessica GalasWesley Myia Webster, PT DPT 03/08/2016, 2:28 PM  [facility] [address] [place], [place], [address] Phone: [phone]   Fax:  [phone]  Name: Jessica NephewLinda Webster MRN: [id number] Date of Birth: 1945-09-14

## 2016-03-10 ENCOUNTER — Ambulatory Visit: Payer: Medicare Other

## 2016-03-10 VITALS — BP 122/71

## 2016-03-10 DIAGNOSIS — R2681 Unsteadiness on feet: Secondary | ICD-10-CM | POA: Diagnosis not present

## 2016-03-10 DIAGNOSIS — M6281 Muscle weakness (generalized): Secondary | ICD-10-CM

## 2016-03-10 NOTE — Therapy (Signed)
[place] [facility] [address] [place], [place], [address] Phone: [phone]   Fax:  [phone]  Physical Therapy Treatment  Patient Details  Name: Jessica NephewLinda Webster MRN: [id number] Date of Birth: July 07, 1945 Referring Provider: Austin Milesynthia Lynn Dunn, PA  Encounter Date: 03/10/2016      PT End of Session - 03/10/16 1401    Visit Number 10   Number of Visits 18   Date for PT Re-Evaluation 03/31/16   Authorization Type 10/ 10 (G Code)   PT Start Time 1301   PT Stop Time 1346   PT Time Calculation (min) 45 min   Equipment Utilized During Treatment Gait belt   Activity Tolerance Patient tolerated treatment well   Behavior During Therapy [facility] for tasks assessed/performed      Past Medical History:  Diagnosis Date  . Alzheimer disease     History reviewed. No pertinent surgical history.  Vitals:   03/10/16 1306  BP: 122/71        Subjective Assessment - 03/10/16 1305    Subjective Patinet reports she's been feeling good and has not fallen since her previous visit.    Pertinent History Hx of Alzheimers; Dementia    Limitations Lifting;Standing;Walking   How long can you stand comfortably? 30 min   How long can you walk comfortably? 10 min   Patient Stated Goals To feel more balanced when performing functional activities.             [facility] Assessment - 03/10/16 1310      Standardized Balance Assessment   Five times sit to stand comments  14sec   10 Meter Walk 1.6958m/s     Berg Balance Test   Sit to Stand Able to stand without using hands and stabilize independently   Standing Unsupported Able to stand safely 2 minutes   Sitting with Back Unsupported but Feet Supported on Floor or Stool Able to sit safely and securely 2 minutes   Stand to Sit Sits safely with minimal use of hands   Transfers Able to transfer safely, minor use of hands   Standing Unsupported with Eyes Closed Able to stand 10 seconds safely   Standing Ubsupported with Feet Together Able to place feet together independently and stand 1 minute safely   From Standing, Reach Forward with Outstretched Arm Can reach forward >12 cm safely (5")   From Standing Position, Pick up Object from Floor Able to pick up shoe safely and easily   From Standing Position, Turn to Look Behind Over each Shoulder Looks behind from both sides and weight shifts well   Turn 360 Degrees Able to turn 360 degrees safely in 4 seconds or less   Standing Unsupported, Alternately Place Feet on Step/Stool Able to stand independently and complete 8 steps >20 seconds   Standing Unsupported, One Foot in Front Able to plae foot ahead of the other independently and hold 30 seconds   Standing on One Leg Able to lift leg independently and hold 5-10 seconds   Total Score 52      Treatment Sit to stands with yellow weighted ball , 2 sets x 10, min VCs for cueing to count along, min VCs to squeeze glutes to stand  Side stepping up and over 8" step - x12 with frequent cueing on hip and foot positioning Single leg stance balance - 3 x 30 sec B Tandem stance balance -- 3 x 30 sec B Heel raises on blue half bolster, 2  sets x 20 reps, 2HHA for security, min VCs to count out loud and for technique  Ball toss with feet together on airex pad - x 20  Kicking ball while standing on airex pad - x 20 bilaterally      PT Education - 03/10/16 1403    Education provided Yes   Education Details Educated on form and technique throughout exercise session   Person(s) Educated Patient   Methods Explanation;Demonstration   Comprehension Verbalized understanding;Returned demonstration             PT Long Term Goals - 03/10/16 1308      PT LONG TERM GOAL #1   Title Pt will score a <16 seconds when performing the 5xSTS to demonstrate significant improvement in functional strength and decrease fall risk   Baseline 5xSTS: 25 sec 03/10/16: 14sec   Time 6   Period Weeks   Status  Achieved     PT LONG TERM GOAL #2   Title Patient will improve her BERG score to >52 to demonstrate significant improvement in static balance and picking items off of the floor.   Baseline BERG: 46 03/10/16: 52   Time 6   Period Weeks   Status On-going     PT LONG TERM GOAL #3   Title Patient will be independent with HEP to conitnue benefits of exercise after discharge from physical therapy.   Baseline Dependent for exercise progression and performance   Time 6   Status On-going     PT LONG TERM GOAL #4   Title Patient will score under 10 secs on the TUG to demonstrate decreased fall risk and improving ability to transfer from sitting to standing   Baseline TUG: 15 sec 03/10/16: 13sec    Time 6   Period Weeks   Status On-going               Plan - 03/10/16 1405    Clinical Impression Statement Pt demonstates improved outcome measures with functional improvements in 39mwt, 5XSTS, and BERG indicating decreased fall risk and improved balance/functional strength. Although patient has improved, she continues to demonstrate increased fall risk and decreased strength and will benefit from further skilled therapy to return to prior level of function.    Rehab Potential Fair   Clinical Impairments Affecting Rehab Potential (+) Highly motivated, Family (-) Alzheimers, age   PT Frequency 2x / week   PT Duration 6 weeks   PT Treatment/Interventions Gait training;ADLs/Self Care Home Management;Moist Heat;Balance training;Therapeutic exercise;Therapeutic activities;Neuromuscular re-education;Patient/family education;Passive range of motion   PT Next Visit Plan Progress functional strengthening and balance   PT Home Exercise Plan Seated hip abduction, sit to stands   Consulted and Agree with Plan of Care Patient      Patient will benefit from skilled therapeutic intervention in order to improve the following deficits and impairments:  Abnormal gait, Impaired sensation, Decreased  strength, Decreased endurance, Decreased safety awareness, Decreased balance, Decreased coordination, Postural dysfunction, Increased muscle spasms  Visit Diagnosis: Muscle weakness (generalized) - Plan: PT plan of care cert/re-cert  Unsteadiness on feet - Plan: PT plan of care cert/re-cert       G-Codes - 03/10/16 1422    Functional Assessment Tool Used 5XSTS, BERG, TUG, 5339m walk test, clinical judgement   Functional Limitation Mobility: Walking and moving around   Mobility: Walking and Moving Around Current Status ([id number]) At least 20 percent but less than 40 percent impaired, limited or restricted   Mobility: Walking and Moving Around Goal  Status [id number]) 0 percent impaired, limited or restricted      Problem List There are no active problems to display for this patient.   Myrene Galas, PT DPT 03/10/2016, 2:24 PM  [place] [facility] MAIN [facility] SERVICES [address] [place], [place], [address] Phone: [phone]   Fax:  [phone]  Name: Jessica Webster MRN: [id number] Date of Birth: 1945/08/01

## 2016-03-15 ENCOUNTER — Ambulatory Visit: Payer: Medicare Other

## 2016-03-15 DIAGNOSIS — R2681 Unsteadiness on feet: Secondary | ICD-10-CM | POA: Diagnosis not present

## 2016-03-15 DIAGNOSIS — M6281 Muscle weakness (generalized): Secondary | ICD-10-CM

## 2016-03-15 NOTE — Therapy (Signed)
[place] [facility] [address] [place], [place], [address] Phone: [phone]   Fax:  [phone]  Physical Therapy Treatment  Patient Details  Name: Jessica NephewLinda Webster MRN: [id number] Date of Birth: [birth year] Referring Provider: Austin Milesynthia Lynn Dunn, PA  Encounter Date: 03/15/2016      PT End of Session - 03/15/16 1340    Visit Number 11   Number of Visits 18   Date for PT Re-Evaluation 03/31/16   Authorization Type 1/ 10 (G Code)   PT Start Time 1300   PT Stop Time 1345   PT Time Calculation (min) 45 min   Equipment Utilized During Treatment Gait belt   Activity Tolerance Patient tolerated treatment well   Behavior During Therapy [facility] for tasks assessed/performed      Past Medical History:  Diagnosis Date  . Alzheimer disease     History reviewed. No pertinent surgical history.  There were no vitals filed for this visit.      Subjective Assessment - 03/15/16 1309    Subjective Patient reports no new complaints since the previous visit. States she has not fallen since the visit.    Pertinent History Hx of Alzheimers; Dementia    Limitations Lifting;Standing;Walking   How long can you stand comfortably? 30 min   How long can you walk comfortably? 10 min   Patient Stated Goals To feel more balanced when performing functional activities.    Currently in Pain? No/denies      Treatment Sit to stands --  3 sets x 10, min VCs for cueing to count along, min VCs to squeeze glutes to stand  Ball squeeze/glute squeeze - 3 x 20 with max cueing on performance Leg Press on the Quantum - 3 x 20 with cueing on foot position Cone taps from airex pad w Heel raises on airex pad --  2 sets x 20 reps, 2HHA for security, min VCs to count out loud and for technique  Side stepping up and over Bosu - x12 with frequent cueing on hip,foot, and direction of movement positioning Hip abduction in standing on airex pad - 2 x 10 Step ups onto Bosu  ball with cueing on LE positioning - 2 x 10        PT Education - 03/15/16 1339    Education provided Yes   Education Details Educated on form and technique and cueing on motion throughout session   Person(s) Educated Patient   Methods Explanation;Demonstration   Comprehension Verbalized understanding;Returned demonstration             PT Long Term Goals - 03/10/16 1308      PT LONG TERM GOAL #1   Title Pt will score a <16 seconds when performing the 5xSTS to demonstrate significant improvement in functional strength and decrease fall risk   Baseline 5xSTS: 25 sec 03/10/16: 14sec   Time 6   Period Weeks   Status Achieved     PT LONG TERM GOAL #2   Title Patient will improve her BERG score to >52 to demonstrate significant improvement in static balance and picking items off of the floor.   Baseline BERG: 46 03/10/16: 52   Time 6   Period Weeks   Status On-going     PT LONG TERM GOAL #3   Title Patient will be independent with HEP to conitnue benefits of exercise after discharge from physical therapy.   Baseline Dependent for exercise progression and performance   Time 6  Status On-going     PT LONG TERM GOAL #4   Title Patient will score under 10 secs on the TUG to demonstrate decreased fall risk and improving ability to transfer from sitting to standing   Baseline TUG: 15 sec 03/10/16: 13sec    Time 6   Period Weeks   Status On-going               Plan - 03/15/16 1341    Clinical Impression Statement Pt demonstrates improved strength compared to previous visits indicating functional carryover between visits. Patient follows instruction better with cueing to count during exercise with patient maintaining on task. Patient continues to demonstrate decreased strength and balance and will benefit from further skilled therapy to return to prior level of function.    Rehab Potential Fair   Clinical Impairments Affecting Rehab Potential (+) Highly motivated,  Family (-) Alzheimers, age   PT Frequency 2x / week   PT Duration 6 weeks   PT Treatment/Interventions Gait training;ADLs/Self Care Home Management;Moist Heat;Balance training;Therapeutic exercise;Therapeutic activities;Neuromuscular re-education;Patient/family education;Passive range of motion   PT Next Visit Plan Progress functional strengthening and balance   PT Home Exercise Plan Seated hip abduction, sit to stands   Consulted and Agree with Plan of Care Patient      Patient will benefit from skilled therapeutic intervention in order to improve the following deficits and impairments:  Abnormal gait, Impaired sensation, Decreased strength, Decreased endurance, Decreased safety awareness, Decreased balance, Decreased coordination, Postural dysfunction, Increased muscle spasms  Visit Diagnosis: Unsteadiness on feet  Muscle weakness (generalized)     Problem List There are no active problems to display for this patient.   Jessica GalasWesley Rubina Webster, PT DPT 03/15/2016, 1:52 PM  [place] [facility] [address] [place], [place], [address] Phone: [phone]   Fax:  [phone]  Name: Jessica NephewLinda Webster MRN: [id number] Date of Birth: 1946/04/22

## 2016-03-22 ENCOUNTER — Ambulatory Visit: Payer: Medicare Other

## 2016-03-22 DIAGNOSIS — R2681 Unsteadiness on feet: Secondary | ICD-10-CM | POA: Diagnosis not present

## 2016-03-22 DIAGNOSIS — M6281 Muscle weakness (generalized): Secondary | ICD-10-CM

## 2016-03-22 NOTE — Therapy (Signed)
[place] [facility] [address] [place], [place], [address] Phone: [phone]   Fax:  [phone]  Physical Therapy Treatment  Patient Details  Name: Jessica NephewLinda Webster MRN: [id number] Date of Birth: 02/01/1946 Referring Provider: Austin Milesynthia Lynn Dunn, PA  Encounter Date: 03/22/2016      PT End of Session - 03/22/16 1321    Visit Number 12   Number of Visits 18   Date for PT Re-Evaluation 03/31/16   Authorization Type 2/ 10 (G Code)   PT Start Time 1301   PT Stop Time 1345   PT Time Calculation (min) 44 min   Equipment Utilized During Treatment Gait belt   Activity Tolerance Patient tolerated treatment well   Behavior During Therapy [facility] for tasks assessed/performed      Past Medical History:  Diagnosis Date  . Alzheimer disease     History reviewed. No pertinent surgical history.  There were no vitals filed for this visit.      Subjective Assessment - 03/22/16 1320    Subjective Patient returns and reports no new complaints and denies any falls since the previous visit.    Pertinent History Hx of Alzheimers; Dementia    Limitations Lifting;Standing;Walking   How long can you stand comfortably? 30 min   How long can you walk comfortably? 10 min   Patient Stated Goals To feel more balanced when performing functional activities.    Currently in Pain? No/denies       Treatment Sit to stands --  2 sets x 15, x10 (stopped early secondary to fatigue)  min VCs for cueing to count along, min VCs to squeeze glutes to stand  Ball squeeze/glute squeeze - 3 x 20 with max cueing on performance Leg Press on the Quantum - 3 x 20 with cueing on foot position 135# 4 square stepping with GTB around knees - 4 min Monster walks with GTB - 5330ft x 4 and HHA Heel raises with UE support - 2 x 20 Tandem amb with mina from PT for maintaining balance - 2 x 5130ft Single leg stance at treadmill with intermittent UE support - 2 x 1 min        PT Education - 03/22/16 1321    Education provided Yes   Education Details Eductaed on form and technique throughout therapy session   Person(s) Educated Patient   Methods Explanation;Demonstration   Comprehension Verbalized understanding;Returned demonstration             PT Long Term Goals - 03/10/16 1308      PT LONG TERM GOAL #1   Title Pt will score a <16 seconds when performing the 5xSTS to demonstrate significant improvement in functional strength and decrease fall risk   Baseline 5xSTS: 25 sec 03/10/16: 14sec   Time 6   Period Weeks   Status Achieved     PT LONG TERM GOAL #2   Title Patient will improve her BERG score to >52 to demonstrate significant improvement in static balance and picking items off of the floor.   Baseline BERG: 46 03/10/16: 52   Time 6   Period Weeks   Status On-going     PT LONG TERM GOAL #3   Title Patient will be independent with HEP to conitnue benefits of exercise after discharge from physical therapy.   Baseline Dependent for exercise progression and performance   Time 6   Status On-going     PT LONG TERM GOAL #4   Title  Patient will score under 10 secs on the TUG to demonstrate decreased fall risk and improving ability to transfer from sitting to standing   Baseline TUG: 15 sec 03/10/16: 13sec    Time 6   Period Weeks   Status On-going               Plan - 03/22/16 1322    Clinical Impression Statement Pt demonstrates improved functional strength with improved ability to perform sit to stands without assistance indicatring functional carryover between visits. Although patient is improving, she continues to demonstrate decreased muscular endurance and balance and pt will benefit from further skilled therapy to return to prior level of function.    Rehab Potential Fair   Clinical Impairments Affecting Rehab Potential (+) Highly motivated, Family (-) Alzheimers, age   PT Frequency 2x / week   PT Duration 6 weeks   PT  Treatment/Interventions Gait training;ADLs/Self Care Home Management;Moist Heat;Balance training;Therapeutic exercise;Therapeutic activities;Neuromuscular re-education;Patient/family education;Passive range of motion   PT Next Visit Plan Progress functional strengthening and balance   PT Home Exercise Plan Seated hip abduction, sit to stands   Consulted and Agree with Plan of Care Patient      Patient will benefit from skilled therapeutic intervention in order to improve the following deficits and impairments:  Abnormal gait, Impaired sensation, Decreased strength, Decreased endurance, Decreased safety awareness, Decreased balance, Decreased coordination, Postural dysfunction, Increased muscle spasms  Visit Diagnosis: Unsteadiness on feet  Muscle weakness (generalized)     Problem List There are no active problems to display for this patient.   Myrene GalasWesley Jace Fermin, PT DPT 03/22/2016, 1:46 PM  [place] [facility] [address] [place], [place], [address] Phone: [phone]   Fax:  [phone]  Name: Jessica NephewLinda Webster MRN: [id number] Date of Birth: [birth year]

## 2016-03-24 ENCOUNTER — Ambulatory Visit: Payer: Medicare Other

## 2016-03-29 ENCOUNTER — Ambulatory Visit: Payer: Medicare Other | Attending: Neurology

## 2016-03-29 DIAGNOSIS — R2681 Unsteadiness on feet: Secondary | ICD-10-CM | POA: Diagnosis not present

## 2016-03-29 DIAGNOSIS — M6281 Muscle weakness (generalized): Secondary | ICD-10-CM

## 2016-03-29 NOTE — Therapy (Signed)
[place] MAIN [facility] SERVICES [address] [place], [place], [address] Phone: [phone]   Fax:  [phone]  Physical Therapy Treatment  Patient Details  Name: Jessica Webster MRN: [id number] Date of Birth: 1946-03-05 Referring Provider: Austin Miles, PA  Encounter Date: 03/29/2016      PT End of Session - 03/29/16 1316    Visit Number 13   Number of Visits 18   Date for PT Re-Evaluation 04/26/16   Authorization Type 3/ 10 (G Code)   PT Start Time 1300   PT Stop Time 1345   PT Time Calculation (min) 45 min   Equipment Utilized During Treatment Gait belt   Activity Tolerance Patient tolerated treatment well   Behavior During Therapy [facility] for tasks assessed/performed      Past Medical History:  Diagnosis Date  . Alzheimer disease     History reviewed. No pertinent surgical history.  There were no vitals filed for this visit.      Subjective Assessment - 03/29/16 1307    Subjective Patient returns and reports no new complaints since the previous visit. Talked to husband of patient about continuing therapy and would like to continue for another month.    Pertinent History Hx of Alzheimers; Dementia    Limitations Lifting;Standing;Walking   How long can you stand comfortably? 30 min   How long can you walk comfortably? 10 min   Patient Stated Goals To feel more balanced when performing functional activities.    Currently in Pain? No/denies            [facility] PT Assessment - 03/29/16 1319      Berg Balance Test   Sit to Stand Able to stand without using hands and stabilize independently   Standing Unsupported Able to stand safely 2 minutes   Sitting with Back Unsupported but Feet Supported on Floor or Stool Able to sit safely and securely 2 minutes   Stand to Sit Sits safely with minimal use of hands   Transfers Able to transfer safely, minor use of hands   Standing Unsupported with Eyes Closed Able to stand 10 seconds  safely   Standing Ubsupported with Feet Together Able to place feet together independently and stand 1 minute safely   From Standing, Reach Forward with Outstretched Arm Can reach forward >12 cm safely (5")   From Standing Position, Pick up Object from Floor Able to pick up shoe safely and easily   From Standing Position, Turn to Look Behind Over each Shoulder Looks behind from both sides and weight shifts well   Turn 360 Degrees Able to turn 360 degrees safely in 4 seconds or less   Standing Unsupported, Alternately Place Feet on Step/Stool Able to stand independently and safely and complete 8 steps in 20 seconds   Standing Unsupported, One Foot in Front Able to place foot tandem independently and hold 30 seconds   Standing on One Leg Able to lift leg independently and hold > 10 seconds   Total Score 55     Timed Up and Go Test   Normal TUG (seconds) 13.5        Treatment Sit to stands --  2 sets x 15, x10 (stopped early secondary to fatigue)  min VCs for cueing to count along, min VCs to squeeze glutes to stand  Ball squeeze/glute squeeze - 2 x 20 with max cueing on performance Leg Press on the Quantum - x 20 with cueing on foot position 135#,  2 x 20 150# 4 square stepping with GTB around knees - 3 min Standing hip abduction - 2 x 20 B with cueing on hip position Tandem amb  from PT for maintaining balance - 2 x 7930ft Single leg stance at treadmill with intermittent UE support - 2 x 1 min        PT Education - 03/29/16 1314    Education provided Yes   Education Details Educated on counting with cueing on exercise performance throughout session   Person(s) Educated Patient   Methods Explanation;Demonstration   Comprehension Returned demonstration;Verbalized understanding             PT Long Term Goals - 03/29/16 1337      PT LONG TERM GOAL #1   Title Pt will score a <16 seconds when performing the 5xSTS to demonstrate significant improvement in functional strength and  decrease fall risk   Baseline 5xSTS: 25 sec 03/10/16: 14sec   Time 6   Period Weeks   Status Achieved     PT LONG TERM GOAL #2   Title Patient will improve her BERG score to >52 to demonstrate significant improvement in static balance and picking items off of the floor.   Baseline BERG: 46 03/10/16: 52 03/29/16: 55   Time 6   Period Weeks   Status Achieved     PT LONG TERM GOAL #3   Title Patient will be independent with HEP to conitnue benefits of exercise after discharge from physical therapy.   Baseline Dependent for exercise progression and performance 03/29/16: Requires total cueing for exercise performance   Time 6   Status On-going     PT LONG TERM GOAL #4   Title Patient will score under 10 secs on the TUG to demonstrate decreased fall risk and improving ability to transfer from sitting to standing   Baseline TUG: 15 sec 03/10/16: 13sec 03/29/16: 13.5sec    Time 6   Period Weeks   Status On-going               Plan - 03/29/16 1343    Clinical Impression Statement Pt demonstrates significant improvement in BERG balance indicating significant improvement in static balance. Patient demonstratres increased confusion today and requires increased cueing for exercise performance. Patient continues to struggle with dynamic balance and performance of exercises and teaching of exercises for home. Patient will benefit from further skilled therapy focused on improving muscular strength and dynamic balance to further decrease fall risk.    Rehab Potential Fair   Clinical Impairments Affecting Rehab Potential (+) Highly motivated, Family (-) Alzheimers, age   PT Frequency 2x / week   PT Duration 6 weeks   PT Treatment/Interventions Gait training;ADLs/Self Care Home Management;Moist Heat;Balance training;Therapeutic exercise;Therapeutic activities;Neuromuscular re-education;Patient/family education;Passive range of motion   PT Next Visit Plan Progress functional strengthening and  balance   PT Home Exercise Plan Seated hip abduction, sit to stands   Consulted and Agree with Plan of Care Patient      Patient will benefit from skilled therapeutic intervention in order to improve the following deficits and impairments:  Abnormal gait, Impaired sensation, Decreased strength, Decreased endurance, Decreased safety awareness, Decreased balance, Decreased coordination, Postural dysfunction, Increased muscle spasms  Visit Diagnosis: Unsteadiness on feet  Muscle weakness (generalized)     Problem List There are no active problems to display for this patient.   Myrene GalasWesley Cydni Reddoch, PT DPT 03/29/2016, 1:50 PM  [facility] [address] [place], [place], [address] Phone: [phone]   Fax:  [phone]  Name: Rock NephewLinda Robers MRN: [id number] Date of Birth: 1945/10/25

## 2016-04-05 ENCOUNTER — Ambulatory Visit: Payer: Medicare Other

## 2016-04-05 DIAGNOSIS — M6281 Muscle weakness (generalized): Secondary | ICD-10-CM

## 2016-04-05 DIAGNOSIS — R2681 Unsteadiness on feet: Secondary | ICD-10-CM | POA: Diagnosis not present

## 2016-04-05 NOTE — Therapy (Signed)
[place] [facility] [address] [place], [place], [address] Phone: [phone]   Fax:  [phone]  Physical Therapy Treatment  Patient Details  Name: Jessica NephewLinda Webster MRN: [id number] Date of Birth: 08-10-1945 Referring Provider: Austin Milesynthia Lynn Dunn, PA  Encounter Date: 04/05/2016      PT End of Session - 04/05/16 1411    Visit Number 14   Number of Visits 18   Date for PT Re-Evaluation 04/26/16   Authorization Type 4/ 10 (G Code)   PT Start Time 1345   PT Stop Time 1430   PT Time Calculation (min) 45 min   Equipment Utilized During Treatment Gait belt   Activity Tolerance Patient tolerated treatment well   Behavior During Therapy [facility] for tasks assessed/performed      Past Medical History:  Diagnosis Date  . Alzheimer disease     History reviewed. No pertinent surgical history.  There were no vitals filed for this visit.      Subjective Assessment - 04/05/16 1401    Subjective Patient states she has not had any falls since the previous visit session.    Pertinent History Hx of Alzheimers; Dementia    Limitations Lifting;Standing;Walking   How long can you stand comfortably? 30 min   How long can you walk comfortably? 10 min   Patient Stated Goals To feel more balanced when performing functional activities.    Currently in Pain? No/denies        Treatment Sit to stands --  2 sets x10 min VCs for cueing to count along, min VCs to squeeze glutes to stand with Black tubing  Ball squeeze/glute squeeze - 2 x 20 with max cueing on performance Leg Press on the Quantum - x 20 with cueing on foot position 135#, 2 x 20  Marches on double airex pad - 2 x 15 Side stepping up and over double airex pad - x 20  Forward stepping onto double airex pad - x 20  Standing hip abduction - 2 x 20 B with cueing on hip position       PT Education - 04/05/16 1411    Education provided Yes   Education Details Educated on form and  technique throughout session   Person(s) Educated Patient   Methods Explanation;Demonstration   Comprehension Verbalized understanding;Returned demonstration             PT Long Term Goals - 03/29/16 1337      PT LONG TERM GOAL #1   Title Pt will score a <16 seconds when performing the 5xSTS to demonstrate significant improvement in functional strength and decrease fall risk   Baseline 5xSTS: 25 sec 03/10/16: 14sec   Time 6   Period Weeks   Status Achieved     PT LONG TERM GOAL #2   Title Patient will improve her BERG score to >52 to demonstrate significant improvement in static balance and picking items off of the floor.   Baseline BERG: 46 03/10/16: 52 03/29/16: 55   Time 6   Period Weeks   Status Achieved     PT LONG TERM GOAL #3   Title Patient will be independent with HEP to conitnue benefits of exercise after discharge from physical therapy.   Baseline Dependent for exercise progression and performance 03/29/16: Requires total cueing for exercise performance   Time 6   Status On-going     PT LONG TERM GOAL #4   Title Patient will score under 10 secs on  the TUG to demonstrate decreased fall risk and improving ability to transfer from sitting to standing   Baseline TUG: 15 sec 03/10/16: 13sec 03/29/16: 13.5sec    Time 6   Period Weeks   Status On-going               Plan - 04/05/16 1431    Clinical Impression Statement Pt started more conversation today during treatment. Patient demonstrates increased fatigue with exercise performance indicating decreased muscular endurance. Patient demonstrates increased postural sway with exercise indicating decreased dynamic/static balance; patient will benefit from further skilled therapy to return to prior level of function.    Rehab Potential Fair   Clinical Impairments Affecting Rehab Potential (+) Highly motivated, Family (-) Alzheimers, age   PT Frequency 2x / week   PT Duration 6 weeks   PT Treatment/Interventions  Gait training;ADLs/Self Care Home Management;Moist Heat;Balance training;Therapeutic exercise;Therapeutic activities;Neuromuscular re-education;Patient/family education;Passive range of motion   PT Next Visit Plan Progress functional strengthening and balance   PT Home Exercise Plan Seated hip abduction, sit to stands   Consulted and Agree with Plan of Care Patient      Patient will benefit from skilled therapeutic intervention in order to improve the following deficits and impairments:  Abnormal gait, Impaired sensation, Decreased strength, Decreased endurance, Decreased safety awareness, Decreased balance, Decreased coordination, Postural dysfunction, Increased muscle spasms  Visit Diagnosis: Unsteadiness on feet  Muscle weakness (generalized)     Problem List There are no active problems to display for this patient.   Jessica Webster, PT DPT 04/05/2016, 5:22 PM  [place] [facility] [address] [place], [place], [address] Phone: [phone]   Fax:  [phone]  Name: Jessica NephewLinda Webster MRN: [id number] Date of Birth: February 13, 1946

## 2016-04-12 ENCOUNTER — Ambulatory Visit: Payer: Medicare Other

## 2016-04-12 DIAGNOSIS — R2681 Unsteadiness on feet: Secondary | ICD-10-CM | POA: Diagnosis not present

## 2016-04-12 DIAGNOSIS — M6281 Muscle weakness (generalized): Secondary | ICD-10-CM

## 2016-04-12 NOTE — Therapy (Signed)
[place] [facility] [address] [place], [place], [address] Phone: [phone]   Fax:  [phone]  Physical Therapy Treatment  Patient Details  Name: Jessica Webster MRN: [id number] Date of Birth: June 01, 1945 Referring Provider: Austin Milesynthia Lynn Dunn, PA  Encounter Date: 04/12/2016      PT End of Session - 04/12/16 1320    Visit Number 15   Number of Visits 18   Date for PT Re-Evaluation 04/26/16   Authorization Type 5/ 10 (G Code)   PT Start Time 1300   PT Stop Time 1345   PT Time Calculation (min) 45 min   Equipment Utilized During Treatment Gait belt   Activity Tolerance Patient tolerated treatment well   Behavior During Therapy [facility] for tasks assessed/performed      Past Medical History:  Diagnosis Date  . Alzheimer disease     History reviewed. No pertinent surgical history.  There were no vitals filed for this visit.      Subjective Assessment - 04/12/16 1306    Subjective Patient reports she has not had any falls since the previous visit and is feeling good today.   Pertinent History Hx of Alzheimers; Dementia    Limitations Lifting;Standing;Walking   How long can you stand comfortably? 30 min   How long can you walk comfortably? 10 min   Patient Stated Goals To feel more balanced when performing functional activities.    Currently in Pain? No/denies      Treatment Sit to stands --  2 sets x10 min VCs for cueing to count along, min VCs to squeeze glutes to stand with Black tubing  Ball squeeze/glute squeeze - 2 x 20 with max cueing on performance Leg Press on the Quantum - with cueing on foot position 135#, 3 x 15  Forward stepping onto 8" pad - x 10 Bilaterally Side stepping up and over Bosu  - x 20       PT Education - 04/12/16 1312    Education provided Yes   Education Details Form and technique throughout treatment   Person(s) Educated Patient   Methods Explanation;Demonstration   Comprehension  Verbalized understanding;Returned demonstration             PT Long Term Goals - 03/29/16 1337      PT LONG TERM GOAL #1   Title Pt will score a <16 seconds when performing the 5xSTS to demonstrate significant improvement in functional strength and decrease fall risk   Baseline 5xSTS: 25 sec 03/10/16: 14sec   Time 6   Period Weeks   Status Achieved     PT LONG TERM GOAL #2   Title Patient will improve her BERG score to >52 to demonstrate significant improvement in static balance and picking items off of the floor.   Baseline BERG: 46 03/10/16: 52 03/29/16: 55   Time 6   Period Weeks   Status Achieved     PT LONG TERM GOAL #3   Title Patient will be independent with HEP to conitnue benefits of exercise after discharge from physical therapy.   Baseline Dependent for exercise progression and performance 03/29/16: Requires total cueing for exercise performance   Time 6   Status On-going     PT LONG TERM GOAL #4   Title Patient will score under 10 secs on the TUG to demonstrate decreased fall risk and improving ability to transfer from sitting to standing   Baseline TUG: 15 sec 03/10/16: 13sec 03/29/16: 13.5sec  Time 6   Period Weeks   Status On-going               Plan - 04/12/16 1336    Clinical Impression Statement Pt required more sitting rest breaks during treatment session today and displays a cough throughout the treatment. Patient has more breathlessness throughout session and performed less exercises secondary to increased breathlessness and cough. Patient continues to demonstrate decreased strength and will benefit from further skilled therapy to return to prior level of function.    Rehab Potential Fair   Clinical Impairments Affecting Rehab Potential (+) Highly motivated, Family (-) Alzheimers, age   PT Frequency 2x / week   PT Duration 6 weeks   PT Treatment/Interventions Gait training;ADLs/Self Care Home Management;Moist Heat;Balance training;Therapeutic  exercise;Therapeutic activities;Neuromuscular re-education;Patient/family education;Passive range of motion   PT Next Visit Plan Progress functional strengthening and balance   PT Home Exercise Plan Seated hip abduction, sit to stands   Consulted and Agree with Plan of Care Patient      Patient will benefit from skilled therapeutic intervention in order to improve the following deficits and impairments:  Abnormal gait, Impaired sensation, Decreased strength, Decreased endurance, Decreased safety awareness, Decreased balance, Decreased coordination, Postural dysfunction, Increased muscle spasms  Visit Diagnosis: Unsteadiness on feet  Muscle weakness (generalized)     Problem List There are no active problems to display for this patient.   Myrene GalasWesley Zonia Caplin, PT DPT 04/12/2016, 1:56 PM  [place] [facility] [address] [place], [place], [address] Phone: [phone]   Fax:  [phone]  Name: Jessica Webster MRN: [id number] Date of Birth: 15-Jun-1945

## 2016-04-14 ENCOUNTER — Ambulatory Visit: Payer: Medicare Other

## 2016-04-14 DIAGNOSIS — R2681 Unsteadiness on feet: Secondary | ICD-10-CM

## 2016-04-14 DIAGNOSIS — M6281 Muscle weakness (generalized): Secondary | ICD-10-CM

## 2016-04-14 NOTE — Therapy (Signed)
[place] [facility] [address] [place], [place], [address] Phone: [phone]   Fax:  [phone]  Physical Therapy Treatment  Patient Details  Name: Jessica NephewLinda Webster MRN: [id number] Date of Birth: 09-Apr-1946 Referring Provider: Austin Milesynthia Lynn Dunn, PA  Encounter Date: 04/14/2016      PT End of Session - 04/14/16 1341    Visit Number 16   Number of Visits 18   Date for PT Re-Evaluation 04/26/16   Authorization Type 6/ 10 (G Code)   PT Start Time 1300   PT Stop Time 1345   PT Time Calculation (min) 45 min   Equipment Utilized During Treatment Gait belt   Activity Tolerance Patient tolerated treatment well   Behavior During Therapy [facility] for tasks assessed/performed      Past Medical History:  Diagnosis Date  . Alzheimer disease     History reviewed. No pertinent surgical history.  There were no vitals filed for this visit.      Subjective Assessment - 04/14/16 1311    Subjective Patient reports no new changes since the previous visit. Husband reports today has "not been a good day".    Patient is accompained by: Family member  husband   Pertinent History Hx of Alzheimers; Dementia    Limitations Lifting;Standing;Walking   How long can you stand comfortably? 30 min   How long can you walk comfortably? 10 min   Patient Stated Goals To feel more balanced when performing functional activities.    Currently in Pain? No/denies      Treatment Sit to stands --  2 sets x12 min VCs for cueing to count along, min VCs to squeeze glutes to  [place] DowntownBall squeeze/glute squeeze - 2 x 20 with max cueing on performance Leg Press on the Quantum - with cueing on foot position 150#, 3 x 20 Lateral walk outs at MATRIX - 7.5# x 3 back and forth Diagonal step ups onto Bosu - x 20 Bilaterally Heel raises on airex pad - 2 x 20 with UE support         PT Education - 04/14/16 1338    Education provided Yes   Education Details form and  technique throughout treatment    Person(s) Educated Patient   Methods Explanation;Demonstration   Comprehension Verbalized understanding;Returned demonstration             PT Long Term Goals - 03/29/16 1337      PT LONG TERM GOAL #1   Title Pt will score a <16 seconds when performing the 5xSTS to demonstrate significant improvement in functional strength and decrease fall risk   Baseline 5xSTS: 25 sec 03/10/16: 14sec   Time 6   Period Weeks   Status Achieved     PT LONG TERM GOAL #2   Title Patient will improve her BERG score to >52 to demonstrate significant improvement in static balance and picking items off of the floor.   Baseline BERG: 46 03/10/16: 52 03/29/16: 55   Time 6   Period Weeks   Status Achieved     PT LONG TERM GOAL #3   Title Patient will be independent with HEP to conitnue benefits of exercise after discharge from physical therapy.   Baseline Dependent for exercise progression and performance 03/29/16: Requires total cueing for exercise performance   Time 6   Status On-going     PT LONG TERM GOAL #4   Title Patient will score under 10 secs on the TUG to demonstrate  decreased fall risk and improving ability to transfer from sitting to standing   Baseline TUG: 15 sec 03/10/16: 13sec 03/29/16: 13.5sec    Time 6   Period Weeks   Status On-going               Plan - 04/14/16 1343    Clinical Impression Statement Continued to focus on strengthening the patient's LE strength to decrease fall risk. Patient continues to require frequent cueing for exercises and displaying LE weakness. Patient will benefit from further skilled therapy to return to prior level of function.    Rehab Potential Fair   Clinical Impairments Affecting Rehab Potential (+) Highly motivated, Family (-) Alzheimers, age   PT Frequency 2x / week   PT Duration 6 weeks   PT Treatment/Interventions Gait training;ADLs/Self Care Home Management;Moist Heat;Balance training;Therapeutic  exercise;Therapeutic activities;Neuromuscular re-education;Patient/family education;Passive range of motion   PT Next Visit Plan Progress functional strengthening and balance   PT Home Exercise Plan Seated hip abduction, sit to stands   Consulted and Agree with Plan of Care Patient      Patient will benefit from skilled therapeutic intervention in order to improve the following deficits and impairments:  Abnormal gait, Impaired sensation, Decreased strength, Decreased endurance, Decreased safety awareness, Decreased balance, Decreased coordination, Postural dysfunction, Increased muscle spasms  Visit Diagnosis: Unsteadiness on feet  Muscle weakness (generalized)     Problem List There are no active problems to display for this patient.   Myrene GalasWesley Sayan Aldava, PT DPT 04/14/2016, 1:54 PM  [place] [facility] [address] [place], [place], [address] Phone: [phone]   Fax:  [phone]  Name: Jessica NephewLinda Webster MRN: [id number] Date of Birth: May 20, 1945

## 2016-04-21 ENCOUNTER — Ambulatory Visit: Payer: Medicare Other

## 2016-04-21 DIAGNOSIS — M6281 Muscle weakness (generalized): Secondary | ICD-10-CM

## 2016-04-21 DIAGNOSIS — R2681 Unsteadiness on feet: Secondary | ICD-10-CM

## 2016-04-21 NOTE — Therapy (Signed)
[facility] [address] [place], [place], [address] Phone: [phone]   Fax:  [phone]  Physical Therapy Treatment  Patient Details  Name: Jessica NephewLinda Webster MRN: [id number] Date of Birth: January 11, 1946 Referring Provider: Austin Milesynthia Lynn Dunn, PA  Encounter Date: 04/21/2016      PT End of Session - 04/21/16 1329    Visit Number 17   Number of Visits 18   Date for PT Re-Evaluation 04/26/16   Authorization Type 7/ 10 (G Code)   PT Start Time 1300   PT Stop Time 1345   PT Time Calculation (min) 45 min   Equipment Utilized During Treatment Gait belt   Activity Tolerance Patient tolerated treatment well   Behavior During Therapy [facility] for tasks assessed/performed      Past Medical History:  Diagnosis Date  . Alzheimer disease     History reviewed. No pertinent surgical history.  There were no vitals filed for this visit.      Subjective Assessment - 04/21/16 1322    Subjective Patient reports no new difficulties but husband states she's had bronchitis for one week and continues with symptoms.    Patient is accompained by: Family member  husband   Pertinent History Hx of Alzheimers; Dementia    Limitations Lifting;Standing;Walking   How long can you stand comfortably? 30 min   How long can you walk comfortably? 10 min   Patient Stated Goals To feel more balanced when performing functional activities.    Currently in Pain? No/denies        Treatment Sit to stands --  3 sets x10 min VCs for cueing to count along, min VCs to squeeze glutes to  Hag[place]Ball squeeze/glute squeeze - 3 x 25 with max cueing on performance Leg Press on the Quantum - with cueing on foot position 165#, 3 x 20 Marches on airex - 2 x 10 Semi tandem balance with head turns - right/left, up/down x10 each direction Heel raises on airex pad - 2 x 20 with UE support Hip abduction on airex pad - x 20 B         PT Education - 04/21/16 1328    Education provided Yes   Education Details Speed of performance of exercises and technique   Person(s) Educated Patient   Methods Explanation;Demonstration   Comprehension Verbalized understanding;Returned demonstration             PT Long Term Goals - 03/29/16 1337      PT LONG TERM GOAL #1   Title Pt will score a <16 seconds when performing the 5xSTS to demonstrate significant improvement in functional strength and decrease fall risk   Baseline 5xSTS: 25 sec 03/10/16: 14sec   Time 6   Period Weeks   Status Achieved     PT LONG TERM GOAL #2   Title Patient will improve her BERG score to >52 to demonstrate significant improvement in static balance and picking items off of the floor.   Baseline BERG: 46 03/10/16: 52 03/29/16: 55   Time 6   Period Weeks   Status Achieved     PT LONG TERM GOAL #3   Title Patient will be independent with HEP to conitnue benefits of exercise after discharge from physical therapy.   Baseline Dependent for exercise progression and performance 03/29/16: Requires total cueing for exercise performance   Time 6   Status On-going     PT LONG TERM GOAL #4   Title Patient will  score under 10 secs on the TUG to demonstrate decreased fall risk and improving ability to transfer from sitting to standing   Baseline TUG: 15 sec 03/10/16: 13sec 03/29/16: 13.5sec    Time 6   Period Weeks   Status On-going               Plan - 04/21/16 1332    Clinical Impression Statement Patient demonstrates inccreased LE fasiculations at the end of exercise performance indicating decreased muscular strength and endurance. Performed less exercises today secondary to patient having bronchitis and monitored SpO2 throughout treatment session (patient's SpO2 did not get below 97% during entirity of treatment). Patient will benefit from further skilled therapy to return to prior level of function.    Rehab Potential Fair   Clinical Impairments Affecting Rehab Potential (+)  Highly motivated, Family (-) Alzheimers, age   PT Frequency 2x / week   PT Duration 6 weeks   PT Treatment/Interventions Gait training;ADLs/Self Care Home Management;Moist Heat;Balance training;Therapeutic exercise;Therapeutic activities;Neuromuscular re-education;Patient/family education;Passive range of motion   PT Next Visit Plan Progress functional strengthening and balance   PT Home Exercise Plan Seated hip abduction, sit to stands   Consulted and Agree with Plan of Care Patient      Patient will benefit from skilled therapeutic intervention in order to improve the following deficits and impairments:  Abnormal gait, Impaired sensation, Decreased strength, Decreased endurance, Decreased safety awareness, Decreased balance, Decreased coordination, Postural dysfunction, Increased muscle spasms  Visit Diagnosis: Unsteadiness on feet  Muscle weakness (generalized)     Problem List There are no active problems to display for this patient.   Myrene GalasWesley Aroush Chasse, PT DPT 04/21/2016, 1:56 PM  [facility] [address] [place], [place], [address] Phone: [phone]   Fax:  [phone]  Name: Jessica NephewLinda Webster MRN: [id number] Date of Birth: February 21, 1946

## 2016-04-26 ENCOUNTER — Ambulatory Visit: Payer: Medicare Other | Attending: Neurology

## 2016-04-26 DIAGNOSIS — R2681 Unsteadiness on feet: Secondary | ICD-10-CM | POA: Diagnosis not present

## 2016-04-26 DIAGNOSIS — M6281 Muscle weakness (generalized): Secondary | ICD-10-CM

## 2016-04-26 NOTE — Therapy (Signed)
[facility] [address] [place], [place], [address] Phone: [phone]   Fax:  [phone]  Physical Therapy Treatment  Patient Details  Name: Jessica Webster MRN: [id number] Date of Birth: Oct 24, 1945 Referring Provider: Austin Milesynthia Lynn Dunn, PA  Encounter Date: 04/26/2016      PT End of Session - 04/26/16 1309    Visit Number 18   Number of Visits 26   Date for PT Re-Evaluation 04/26/16   Authorization Type 8/ 10 (G Code)   PT Start Time 1301   PT Stop Time 1345   PT Time Calculation (min) 44 min   Equipment Utilized During Treatment Gait belt   Activity Tolerance Patient tolerated treatment well   Behavior During Therapy WFL for tasks assessed/performed      Past Medical History:  Diagnosis Date  . Alzheimer disease     History reviewed. No pertinent surgical history.  There were no vitals filed for this visit.      Subjective Assessment - 04/26/16 1329    Subjective Patient denies any revcent falls. Husband reports difficulty with transferring from sitting to standing. Husband reports going to see a neurologist next week.    Patient is accompained by: Family member  husband   Pertinent History Hx of Alzheimers; Dementia    Limitations Lifting;Standing;Walking   How long can you stand comfortably? 30 min   How long can you walk comfortably? 10 min   Patient Stated Goals To feel more balanced when performing functional activities.    Currently in Pain? No/denies      TUG: 10sec 5xSTS: 15sec 10mWT: 1.25 m/s   Treatment Sit to stands --  3 sets x10 min VCs for cueing to count along, min VCs to squeeze glutes to  Seated marches with GTB - 2 x 20 B Seated abduction with GTB - 2 x 20  Leg Press on the Quantum - with cueing on foot position 165#, 3 x 20 Heel raises on airex pad - 2 x 20 with UE support Seated ball squeeze - 2 x 30  Marches on airex - 2 x 12       PT Education - 04/26/16 1453    Education  provided Yes   Education Details Cueing to stay on task with exercise and outcome measures    Person(s) Educated Patient   Methods Explanation;Demonstration   Comprehension Verbalized understanding;Returned demonstration             PT Long Term Goals - 04/26/16 1315      PT LONG TERM GOAL #1   Title Pt will score a <16 seconds when performing the 5xSTS to demonstrate significant improvement in functional strength and decrease fall risk   Baseline 5xSTS: 25 sec 03/10/16: 14sec   Time 6   Period Weeks   Status Achieved     PT LONG TERM GOAL #2   Title Patient will improve her BERG score to >52 to demonstrate significant improvement in static balance and picking items off of the floor.   Baseline BERG: 46 03/10/16: 52 03/29/16: 55   Time 6   Period Weeks   Status Achieved     PT LONG TERM GOAL #3   Title Patient will be independent with HEP to conitnue benefits of exercise after discharge from physical therapy.   Baseline Dependent for exercise progression and performance 03/29/16: Requires total cueing for exercise performance; 04/26/16: Requires frequent cueing    Time 6   Status On-going  PT LONG TERM GOAL #4   Title Patient will score under 10 secs on the TUG to demonstrate decreased fall risk and improving ability to transfer from sitting to standing   Baseline TUG: 15 sec 03/10/16: 13sec 03/29/16: 13.5sec  04/26/16: 10sec   Time 6   Period Weeks   Status Achieved               Plan - 04/26/16 1407    Clinical Impression Statement Patient demonstrates singificant improvement in TUG and is able to perform in 1.55m/s indicating decreased fall risk. Although patient is improving, she continues to require frequent verbal cues for exercise performance. Patient walks with her arms crossed and is unable to self correct the pattern and requires frequent cueing on walking safely. Patient will benefit from further skilled therapy to consolidate HEP and further decrease  fall risk as patient's dementia symptoms inhibit functional carryover between visits.    Rehab Potential Fair   Clinical Impairments Affecting Rehab Potential (+) Highly motivated, Family (-) Alzheimers, age   PT Frequency 2x / week   PT Duration 6 weeks   PT Treatment/Interventions Gait training;ADLs/Self Care Home Management;Moist Heat;Balance training;Therapeutic exercise;Therapeutic activities;Neuromuscular re-education;Patient/family education;Passive range of motion   PT Next Visit Plan Progress functional strengthening and balance   PT Home Exercise Plan Seated hip abduction, sit to stands   Consulted and Agree with Plan of Care Patient      Patient will benefit from skilled therapeutic intervention in order to improve the following deficits and impairments:  Abnormal gait, Impaired sensation, Decreased strength, Decreased endurance, Decreased safety awareness, Decreased balance, Decreased coordination, Postural dysfunction, Increased muscle spasms  Visit Diagnosis: Unsteadiness on feet  Muscle weakness (generalized)     Problem List There are no active problems to display for this patient.   Myrene Galas, PT DPT 04/26/2016, 2:55 PM  [facility] MAIN [facility] SERVICES [address] [place], [place], [address] Phone: [phone]   Fax:  [phone]  Name: Jessica Webster MRN: [id number] Date of Birth: 1945-11-27

## 2016-04-28 ENCOUNTER — Ambulatory Visit: Payer: Medicare Other

## 2016-04-28 DIAGNOSIS — R2681 Unsteadiness on feet: Secondary | ICD-10-CM | POA: Diagnosis not present

## 2016-04-28 DIAGNOSIS — M6281 Muscle weakness (generalized): Secondary | ICD-10-CM

## 2016-04-28 NOTE — Therapy (Signed)
[facility] [address] [place], [place], [address] Phone: [phone]   Fax:  [phone]  Physical Therapy Treatment  Patient Details  Name: Jessica NephewLinda Webster MRN: [id number] Date of Birth: 09-16-1945 Referring Provider: Austin Milesynthia Lynn Dunn, PA  Encounter Date: 04/28/2016      PT End of Session - 04/28/16 1333    Visit Number 19   Number of Visits 26   Date for PT Re-Evaluation 05/24/16   Authorization Type 9/ 10 (G Code)   PT Start Time 1302   PT Stop Time 1345   PT Time Calculation (min) 43 min   Equipment Utilized During Treatment Gait belt   Activity Tolerance Patient tolerated treatment well   Behavior During Therapy WFL for tasks assessed/performed      Past Medical History:  Diagnosis Date  . Alzheimer disease     History reviewed. No pertinent surgical history.  There were no vitals filed for this visit.      Subjective Assessment - 04/28/16 1312    Subjective Patient reports no falls since the previous visit and states she feels "okay".   Patient is accompained by: --  husband   Pertinent History Hx of Alzheimers; Dementia    Limitations Lifting;Standing;Walking   How long can you stand comfortably? 30 min   How long can you walk comfortably? 10 min   Patient Stated Goals To feel more balanced when performing functional activities.    Currently in Pain? No/denies      Treatment Sit to stands -- 2 sets x15 min VCs for cueing to count along, min VCs to squeeze glutes with lifts with 5# bar overhead   Seated marches with GTB - 2 x 10 B Seated abduction with GTB - 2 x 20  Resisted lateral stepping at [employer] - 3 laps 7.5# B  Leg Press on the Quantum - with cueing on foot position 165#, 3 x 20 Seated ball squeeze - 2 x 20 4# Heel raises - 2 x 20 with UE support Tandem amb over airex beam - x 5 forward/backward       PT Education - 04/28/16 1331    Education provided Yes   Education Details  Form/technique with exercise   Person(s) Educated Patient   Methods Explanation;Demonstration   Comprehension Verbalized understanding;Returned demonstration             PT Long Term Goals - 04/26/16 1315      PT LONG TERM GOAL #1   Title Pt will score a <16 seconds when performing the 5xSTS to demonstrate significant improvement in functional strength and decrease fall risk   Baseline 5xSTS: 25 sec 03/10/16: 14sec   Time 6   Period Weeks   Status Achieved     PT LONG TERM GOAL #2   Title Patient will improve her BERG score to >52 to demonstrate significant improvement in static balance and picking items off of the floor.   Baseline BERG: 46 03/10/16: 52 03/29/16: 55   Time 6   Period Weeks   Status Achieved     PT LONG TERM GOAL #3   Title Patient will be independent with HEP to conitnue benefits of exercise after discharge from physical therapy.   Baseline Dependent for exercise progression and performance 03/29/16: Requires total cueing for exercise performance; 04/26/16: Requires frequent cueing    Time 6   Status On-going     PT LONG TERM GOAL #4   Title Patient will score  under 10 secs on the TUG to demonstrate decreased fall risk and improving ability to transfer from sitting to standing   Baseline TUG: 15 sec 03/10/16: 13sec 03/29/16: 13.5sec  04/26/16: 10sec   Time 6   Period Weeks   Status Achieved               Plan - 04/28/16 1335    Clinical Impression Statement Patient demonstrates ability to perform more exercises with less rest breaks today indicating functional carryover between visits and improved muscular endurance with exercise. Although patient is improving she continues with decreased dynamic/static balance and will benefit from further skilled therapy to return to prior level of function.    Rehab Potential Fair   Clinical Impairments Affecting Rehab Potential (+) Highly motivated, Family (-) Alzheimers, age   PT Frequency 2x / week   PT Duration  6 weeks   PT Treatment/Interventions Gait training;ADLs/Self Care Home Management;Moist Heat;Balance training;Therapeutic exercise;Therapeutic activities;Neuromuscular re-education;Patient/family education;Passive range of motion   PT Next Visit Plan Progress functional strengthening and balance   PT Home Exercise Plan Seated hip abduction, sit to stands   Consulted and Agree with Plan of Care Patient      Patient will benefit from skilled therapeutic intervention in order to improve the following deficits and impairments:  Abnormal gait, Impaired sensation, Decreased strength, Decreased endurance, Decreased safety awareness, Decreased balance, Decreased coordination, Postural dysfunction, Increased muscle spasms  Visit Diagnosis: Unsteadiness on feet  Muscle weakness (generalized)     Problem List There are no active problems to display for this patient.   Myrene Galas, PT DPT 04/28/2016, 1:38 PM   [facility] MAIN [facility] SERVICES [address] [place], [place], [address] Phone: [phone]   Fax:  [phone]  Name: Jessica Webster MRN: [id number] Date of Birth: 1945/06/21

## 2016-05-05 ENCOUNTER — Encounter: Payer: Self-pay | Admitting: Emergency Medicine

## 2016-05-05 ENCOUNTER — Emergency Department: Payer: Medicare Other

## 2016-05-05 ENCOUNTER — Emergency Department
Admission: EM | Admit: 2016-05-05 | Discharge: 2016-05-05 | Disposition: A | Discharge status: Home / Self Care | Payer: Medicare Other | Attending: Student in an Organized Health Care Education/Training Program | Admitting: Student in an Organized Health Care Education/Training Program

## 2016-05-05 DIAGNOSIS — Z5181 Encounter for therapeutic drug level monitoring: Secondary | ICD-10-CM | POA: Diagnosis not present

## 2016-05-05 DIAGNOSIS — R404 Transient alteration of awareness: Secondary | ICD-10-CM | POA: Insufficient documentation

## 2016-05-05 DIAGNOSIS — Z87891 Personal history of nicotine dependence: Secondary | ICD-10-CM | POA: Insufficient documentation

## 2016-05-05 DIAGNOSIS — Z79899 Other long term (current) drug therapy: Secondary | ICD-10-CM | POA: Insufficient documentation

## 2016-05-05 DIAGNOSIS — R410 Disorientation, unspecified: Secondary | ICD-10-CM | POA: Diagnosis not present

## 2016-05-05 DIAGNOSIS — G309 Alzheimer's disease, unspecified: Secondary | ICD-10-CM | POA: Insufficient documentation

## 2016-05-05 DIAGNOSIS — R4182 Altered mental status, unspecified: Secondary | ICD-10-CM | POA: Diagnosis present

## 2016-05-05 LAB — URINE DRUG SCREEN, QUALITATIVE (ARMC ONLY)
Amphetamines, Ur Screen: NOT DETECTED
Barbiturates, Ur Screen: NOT DETECTED
Benzodiazepine, Ur Scrn: NOT DETECTED
Cannabinoid 50 Ng, Ur ~~LOC~~: NOT DETECTED
Cocaine Metabolite,Ur ~~LOC~~: NOT DETECTED
MDMA (Ecstasy)Ur Screen: NOT DETECTED
Methadone Scn, Ur: NOT DETECTED
Opiate, Ur Screen: NOT DETECTED
Phencyclidine (PCP) Ur S: NOT DETECTED
Tricyclic, Ur Screen: POSITIVE — AB

## 2016-05-05 LAB — TROPONIN I

## 2016-05-05 LAB — URINALYSIS, COMPLETE (UACMP) WITH MICROSCOPIC
Bacteria, UA: NONE SEEN
Bilirubin Urine: NEGATIVE
Glucose, UA: 50 mg/dL — AB
Hgb urine dipstick: NEGATIVE
Ketones, ur: NEGATIVE mg/dL
Leukocytes, UA: NEGATIVE
Nitrite: NEGATIVE
Protein, ur: NEGATIVE mg/dL
RBC / HPF: NONE SEEN RBC/hpf (ref 0–5)
Specific Gravity, Urine: 1.023 (ref 1.005–1.030)
pH: 5 (ref 5.0–8.0)

## 2016-05-05 LAB — COMPREHENSIVE METABOLIC PANEL
ALBUMIN: 3.6 g/dL (ref 3.5–5.0)
ALK PHOS: 80 U/L (ref 38–126)
ALT: 26 U/L (ref 14–54)
ANION GAP: 7 (ref 5–15)
AST: 31 U/L (ref 15–41)
BILIRUBIN TOTAL: 0.4 mg/dL (ref 0.3–1.2)
BUN: 20 mg/dL (ref 6–20)
CALCIUM: 8.5 mg/dL — AB (ref 8.9–10.3)
CO2: 26 mmol/L (ref 22–32)
Chloride: 104 mmol/L (ref 101–111)
Creatinine, Ser: 0.78 mg/dL (ref 0.44–1.00)
GLUCOSE: 96 mg/dL (ref 65–99)
POTASSIUM: 4 mmol/L (ref 3.5–5.1)
Sodium: 137 mmol/L (ref 135–145)
TOTAL PROTEIN: 7.1 g/dL (ref 6.5–8.1)

## 2016-05-05 LAB — CBC
HCT: 32.3 % — ABNORMAL LOW (ref 35.0–47.0)
Hemoglobin: 10 g/dL — ABNORMAL LOW (ref 12.0–16.0)
MCH: 20.5 pg — ABNORMAL LOW (ref 26.0–34.0)
MCHC: 31 g/dL — ABNORMAL LOW (ref 32.0–36.0)
MCV: 66.1 fL — ABNORMAL LOW (ref 80.0–100.0)
Platelets: 199 10*3/uL (ref 150–440)
RBC: 4.89 MIL/uL (ref 3.80–5.20)
RDW: 19.1 % — ABNORMAL HIGH (ref 11.5–14.5)
WBC: 9 10*3/uL (ref 3.6–11.0)

## 2016-05-05 LAB — DIFFERENTIAL
Basophils Absolute: 0.1 10*3/uL (ref 0–0.1)
Basophils Relative: 1 %
Eosinophils Absolute: 0.4 10*3/uL (ref 0–0.7)
Eosinophils Relative: 4 %
Lymphocytes Relative: 8 %
Lymphs Abs: 0.7 10*3/uL — ABNORMAL LOW (ref 1.0–3.6)
Monocytes Absolute: 0.9 10*3/uL (ref 0.2–0.9)
Monocytes Relative: 10 %
Neutro Abs: 7 10*3/uL — ABNORMAL HIGH (ref 1.4–6.5)
Neutrophils Relative %: 77 %

## 2016-05-05 LAB — PROTIME-INR
INR: 1.03
PROTHROMBIN TIME: 13.5 s (ref 11.4–15.2)

## 2016-05-05 LAB — APTT: aPTT: 25 seconds (ref 24–36)

## 2016-05-05 LAB — GLUCOSE, CAPILLARY: Glucose-Capillary: 92 mg/dL (ref 65–99)

## 2016-05-05 MED ORDER — LEVOFLOXACIN 250 MG PO TABS: 250.0000 mg | ORAL_TABLET | Freq: Every day | ORAL | 0 refills | Status: AC

## 2016-05-05 MED ORDER — IPRATROPIUM-ALBUTEROL 0.5-2.5 (3) MG/3ML IN SOLN
3.0000 mL | Freq: Once | RESPIRATORY_TRACT | Status: AC
  Administered 2016-05-05: 3 mL via RESPIRATORY_TRACT
  Filled 2016-05-05: qty 3

## 2016-05-05 NOTE — ED Provider Notes (Signed)
[facility] Emergency Department Provider Note    None    (approximate)  I have reviewed the triage vital signs and the nursing notes.   HISTORY  Chief Complaint Altered Mental Status    HPI Jessica Webster is a 71 y.o. female with a history of Alzheimer's dementia presents with altered mental status confusion and difficulty following commands. According to the daughter the patient was at her baseline at 2:30 today she was driving around running errands with her in the car. Upon getting home and try to get her out of the car the patient was very confused and unable to follow directions as far as how to get out of the car. According to family she was favoring her left leg with a shuffling gait that they have never seen before. They state that she is still confused. Denies any recent fevers. No nausea or vomiting. No changes to any medications. No trauma.   Past Medical History:  Diagnosis Date  . Alzheimer disease    History reviewed. No pertinent family history. Past Surgical History:  Procedure Laterality Date  . APPENDECTOMY    . NASAL SINUS SURGERY     There are no active problems to display for this patient.     Prior to Admission medications   Medication Sig Start Date End Date Taking? Authorizing Provider  cholecalciferol (VITAMIN D) 1000 units tablet Take 1,000 Units by mouth daily.    Historical Provider, MD  cyclobenzaprine (FLEXERIL) 5 MG tablet Take 5 mg by mouth 3 (three) times daily as needed for muscle spasms.    Historical Provider, MD  docusate sodium (COLACE) 100 MG capsule Take 100 mg by mouth 2 (two) times daily.    Historical Provider, MD  donepezil (ARICEPT) 10 MG tablet Take 10 mg by mouth at bedtime.    Historical Provider, MD  memantine (NAMENDA) 10 MG tablet Take 10 mg by mouth 2 (two) times daily.    Historical Provider, MD  montelukast (SINGULAIR) 10 MG tablet Take 10 mg by mouth at bedtime.    Historical Provider, MD    naproxen sodium (ANAPROX) 220 MG tablet Take 220 mg by mouth 2 (two) times daily with a meal.    Historical Provider, MD  nortriptyline (PAMELOR) 10 MG capsule Take 10 mg by mouth at bedtime.    Historical Provider, MD  sertraline (ZOLOFT) 100 MG tablet Take 100 mg by mouth daily.    Historical Provider, MD    Allergies Erythromycin and Sulfa antibiotics    Social History Social History  Substance Use Topics  . Smoking status: Former Games developer  . Smokeless tobacco: Former Neurosurgeon  . Alcohol use No    Review of Systems Patient denies headaches, rhinorrhea, blurry vision, numbness, shortness of breath, chest pain, edema, cough, abdominal pain, nausea, vomiting, diarrhea, dysuria, fevers, rashes or hallucinations unless otherwise stated above in HPI. ____________________________________________   PHYSICAL EXAM:  VITAL SIGNS: Vitals:   05/05/16 1704  BP: 100/67  Pulse: 97  Resp: 18  Temp: 99.8 F (37.7 C)    Constitutional: Alert and in no acute distress. Eyes: Conjunctivae are normal. PERRL. EOMI. Head: Atraumatic. Nose: No congestion/rhinnorhea. Mouth/Throat: Mucous membranes are moist.  Oropharynx non-erythematous. Neck: No stridor. Painless ROM. No cervical spine tenderness to palpation Hematological/Lymphatic/Immunilogical: No cervical lymphadenopathy. Cardiovascular: Normal rate, regular rhythm. Grossly normal heart sounds.  Good peripheral circulation. Respiratory: Normal respiratory effort.  No retractions. Lungs CTAB. Gastrointestinal: Soft and nontender. No distention. No abdominal bruits. No CVA  tenderness. Genitourinary:  Musculoskeletal: No lower extremity tenderness nor edema.  No joint effusions. Neurologic:  Normal speech and language.  Mild expressive and receptive aphasia - likely chronic, Difficulty with one step commands  BLE cogwheel rigidity, no facial droop.  Skin:  Skin is warm, dry and intact. No rash noted. Psychiatric: Mood and affect are normal.  Speech and behavior are normal.  ____________________________________________   LABS (all labs ordered are listed, but only abnormal results are displayed)  Results for orders placed or performed during the hospital encounter of 05/05/16 (from the past 24 hour(s))  Protime-INR     Status: None   Collection Time: 05/05/16  5:47 PM  Result Value Ref Range   Prothrombin Time 13.5 11.4 - 15.2 seconds   INR 1.03   APTT     Status: None   Collection Time: 05/05/16  5:47 PM  Result Value Ref Range   aPTT 25 24 - 36 seconds  CBC     Status: Abnormal   Collection Time: 05/05/16  5:47 PM  Result Value Ref Range   WBC 9.0 3.6 - 11.0 K/uL   RBC 4.89 3.80 - 5.20 MIL/uL   Hemoglobin 10.0 (L) 12.0 - 16.0 g/dL   HCT 78.232.3 (L) 95.635.0 - 21.347.0 %   MCV 66.1 (L) 80.0 - 100.0 fL   MCH 20.5 (L) 26.0 - 34.0 pg   MCHC 31.0 (L) 32.0 - 36.0 g/dL   RDW 08.619.1 (H) 57.811.5 - 46.914.5 %   Platelets 199 150 - 440 K/uL  Differential     Status: Abnormal   Collection Time: 05/05/16  5:47 PM  Result Value Ref Range   Neutrophils Relative % 77 %   Neutro Abs 7.0 (H) 1.4 - 6.5 K/uL   Lymphocytes Relative 8 %   Lymphs Abs 0.7 (L) 1.0 - 3.6 K/uL   Monocytes Relative 10 %   Monocytes Absolute 0.9 0.2 - 0.9 K/uL   Eosinophils Relative 4 %   Eosinophils Absolute 0.4 0 - 0.7 K/uL   Basophils Relative 1 %   Basophils Absolute 0.1 0 - 0.1 K/uL  Comprehensive metabolic panel     Status: Abnormal   Collection Time: 05/05/16  5:47 PM  Result Value Ref Range   Sodium 137 135 - 145 mmol/L   Potassium 4.0 3.5 - 5.1 mmol/L   Chloride 104 101 - 111 mmol/L   CO2 26 22 - 32 mmol/L   Glucose, Bld 96 65 - 99 mg/dL   BUN 20 6 - 20 mg/dL   Creatinine, Ser 6.290.78 0.44 - 1.00 mg/dL   Calcium 8.5 (L) 8.9 - 10.3 mg/dL   Total Protein 7.1 6.5 - 8.1 g/dL   Albumin 3.6 3.5 - 5.0 g/dL   AST 31 15 - 41 U/L   ALT 26 14 - 54 U/L   Alkaline Phosphatase 80 38 - 126 U/L   Total Bilirubin 0.4 0.3 - 1.2 mg/dL   GFR calc non Af Amer >60 >60 mL/min    GFR calc Af Amer >60 >60 mL/min   Anion gap 7 5 - 15  Troponin I     Status: None   Collection Time: 05/05/16  5:47 PM  Result Value Ref Range   Troponin I <0.03 <0.03 ng/mL  Urine Drug Screen, Qualitative ([facility] only)     Status: Abnormal   Collection Time: 05/05/16  5:49 PM  Result Value Ref Range   Tricyclic, Ur Screen POSITIVE (A) NONE DETECTED   Amphetamines, Ur Screen NONE  DETECTED NONE DETECTED   MDMA (Ecstasy)Ur Screen NONE DETECTED NONE DETECTED   Cocaine Metabolite,Ur [place] NONE DETECTED NONE DETECTED   Opiate, Ur Screen NONE DETECTED NONE DETECTED   Phencyclidine (PCP) Ur S NONE DETECTED NONE DETECTED   Cannabinoid 50 Ng, Ur [place] NONE DETECTED NONE DETECTED   Barbiturates, Ur Screen NONE DETECTED NONE DETECTED   Benzodiazepine, Ur Scrn NONE DETECTED NONE DETECTED   Methadone Scn, Ur NONE DETECTED NONE DETECTED  Urinalysis, Complete w Microscopic     Status: Abnormal   Collection Time: 05/05/16  5:49 PM  Result Value Ref Range   Color, Urine YELLOW (A) YELLOW   APPearance CLEAR (A) CLEAR   Specific Gravity, Urine 1.023 1.005 - 1.030   pH 5.0 5.0 - 8.0   Glucose, UA 50 (A) NEGATIVE mg/dL   Hgb urine dipstick NEGATIVE NEGATIVE   Bilirubin Urine NEGATIVE NEGATIVE   Ketones, ur NEGATIVE NEGATIVE mg/dL   Protein, ur NEGATIVE NEGATIVE mg/dL   Nitrite NEGATIVE NEGATIVE   Leukocytes, UA NEGATIVE NEGATIVE   RBC / HPF NONE SEEN 0 - 5 RBC/hpf   WBC, UA 0-5 0 - 5 WBC/hpf   Bacteria, UA NONE SEEN NONE SEEN   Squamous Epithelial / LPF 0-5 (A) NONE SEEN   Mucous PRESENT   Glucose, capillary     Status: None   Collection Time: 05/05/16  5:55 PM  Result Value Ref Range   Glucose-Capillary 92 65 - 99 mg/dL   ____________________________________________  EKG My review and personal interpretation at Time: 17:50   Indication: ams  Rate: 90  Rhythm: sinus Axis: normal Other: non specific T wave abn ____________________________________________  RADIOLOGY  I personally reviewed  all radiographic images ordered to evaluate for the above acute complaints and reviewed radiology reports and findings.  These findings were personally discussed with the patient.  Please see medical record for radiology report. ____________________________________________   PROCEDURES  Procedure(s) performed:  Procedures    Critical Care performed: no ____________________________________________   INITIAL IMPRESSION / ASSESSMENT AND PLAN / ED COURSE  Pertinent labs & imaging results that were available during my care of the patient were reviewed by me and considered in my medical decision making (see chart for details).  DDX: cva, tia, hypoglycemia, dehydration, electrolyte abnormality, dissection, sepsis   Samhita Kretsch is a 71 y.o. who presents to the ED with altered mental status and difficulty following commands with expressive and receptive aphasia.  There is entirely a symptoms until neurology consult. Patient taken emergently to CT scan to evaluate for evidence of bleed or acute ischemia. Presentation is complicated by underlying Alzheimer's. Could be component of metabolic or chronic encephalopathy. We'll check lateralized to evaluate for acute abnormality.  The patient will be placed on continuous pulse oximetry and telemetry for monitoring.  Laboratory evaluation will be sent to evaluate for the above complaints.     Clinical Course as of May 06 21  Thu May 05, 2016  7829 Patient evaluated by tele neurology. Also felt this is more likely underlying dementia possibly worsened by medications. Discussed recommendation and plan of care with Neurology, if encephalopathy clears and patient without any deficit, no indication for admission from a neuro stand point.  CT with no evidence of CVA. According to family patient currently at baseline. Blood work is otherwise reassuring. Currently awaiting urinalysis.  [PR]  2138 Patient up using the restroom currently  [PR]  2213 Family  requesting patient be discharged home. I recommended admission to hospital she does have  some unsteady gait and for evaluation of her encephalopathy. They state they prefer to take her home and she is now close to baseline due to concern for influenza as well as agitation and worsening of her condition being away from home. They understand the risks involved taking the patient home. Given their wishes I think this is a reasonable plan.  Have discussed with the patient and available family all diagnostics and treatments performed thus far and all questions were answered to the best of my ability. The patient demonstrates understanding and agreement with plan.   [PR]    Clinical Course User Index [PR] Willy Eddy, MD     ____________________________________________   FINAL CLINICAL IMPRESSION(S) / ED DIAGNOSES  Final diagnoses:  Confusion  Transient alteration of awareness      NEW MEDICATIONS STARTED DURING THIS VISIT:  New Prescriptions   No medications on file     Note:  This document was prepared using Dragon voice recognition software and may include unintentional dictation errors.    Willy Eddy, MD 05/06/16 [phone]

## 2016-05-05 NOTE — ED Notes (Signed)
Pt able to ambulate with assist to the toilet - then was able to ambulate with minimal assistance to the chair.

## 2016-05-05 NOTE — Progress Notes (Signed)
CH responded to a page to visit a Pt. Pt had stroke and was being evaluated by the medical team. Pt's husband and daughter at bedside. Pt's husband anxious, he told CH that Pt was diagnosed with low or severe Alzheimer. Pt's husband very concerned about wife's present condition. CH offered comfort and ministry of presence to Pt and family.     05/05/16 1900  Clinical Encounter Type  Visited With Patient;Patient and family together  Visit Type Initial;Code;Trauma;Other (Comment)  Referral From Nurse  Consult/Referral To Chaplain  Spiritual Encounters  Spiritual Needs Prayer;Emotional;Other (Comment)

## 2016-05-05 NOTE — Discharge Instructions (Signed)
Please follow up with PCP. Return for any worsening symptoms or concerns.

## 2016-05-05 NOTE — ED Triage Notes (Signed)
Pt presents to ED with her family per her family patient was picked up at approx 1415 and patient was able to walk to the car, however when she tried to get out of the car pt was too weak to get out of the car. Pt's family reports bilateral lower extremity weakness. Pt's family also reports pt has worsening capabilities to understand and speak. Pt is noted to be difficult to get to follow commands, weak grip strength bilaterally, bilateral lower extremity drift, facial symmetry intact at this time.

## 2016-05-06 ENCOUNTER — Other Ambulatory Visit: Payer: Self-pay | Admitting: Neurology

## 2016-05-06 DIAGNOSIS — R269 Unspecified abnormalities of gait and mobility: Secondary | ICD-10-CM

## 2016-05-06 DIAGNOSIS — F0280 Dementia in other diseases classified elsewhere without behavioral disturbance: Secondary | ICD-10-CM

## 2016-05-18 ENCOUNTER — Ambulatory Visit
Admission: RE | Admit: 2016-05-18 | Discharge: 2016-05-18 | Disposition: A | Discharge status: Home / Self Care | Payer: Medicare Other | Source: Ambulatory Visit | Attending: Neurology | Admitting: Neurology

## 2016-05-18 DIAGNOSIS — R269 Unspecified abnormalities of gait and mobility: Secondary | ICD-10-CM | POA: Diagnosis present

## 2016-05-18 DIAGNOSIS — I679 Cerebrovascular disease, unspecified: Secondary | ICD-10-CM | POA: Insufficient documentation

## 2016-05-18 DIAGNOSIS — F0280 Dementia in other diseases classified elsewhere without behavioral disturbance: Secondary | ICD-10-CM | POA: Insufficient documentation

## 2016-07-25 ENCOUNTER — Encounter: Payer: Self-pay | Admitting: Emergency Medicine

## 2016-07-25 ENCOUNTER — Emergency Department: Payer: Medicare Other

## 2016-07-25 ENCOUNTER — Emergency Department
Admission: EM | Admit: 2016-07-25 | Discharge: 2016-07-25 | Disposition: A | Discharge status: Home / Self Care | Payer: Medicare Other | Attending: Emergency Medicine | Admitting: Emergency Medicine

## 2016-07-25 DIAGNOSIS — F0390 Unspecified dementia without behavioral disturbance: Secondary | ICD-10-CM | POA: Diagnosis not present

## 2016-07-25 DIAGNOSIS — Z79899 Other long term (current) drug therapy: Secondary | ICD-10-CM | POA: Diagnosis not present

## 2016-07-25 DIAGNOSIS — D509 Iron deficiency anemia, unspecified: Secondary | ICD-10-CM | POA: Diagnosis not present

## 2016-07-25 DIAGNOSIS — Z87891 Personal history of nicotine dependence: Secondary | ICD-10-CM | POA: Diagnosis not present

## 2016-07-25 DIAGNOSIS — M545 Low back pain: Secondary | ICD-10-CM | POA: Diagnosis present

## 2016-07-25 LAB — COMPREHENSIVE METABOLIC PANEL
ALT: 28 U/L (ref 14–54)
AST: 34 U/L (ref 15–41)
Albumin: 3.3 g/dL — ABNORMAL LOW (ref 3.5–5.0)
Alkaline Phosphatase: 81 U/L (ref 38–126)
Anion gap: 8 (ref 5–15)
BILIRUBIN TOTAL: 0.2 mg/dL — AB (ref 0.3–1.2)
BUN: 17 mg/dL (ref 6–20)
CALCIUM: 8.7 mg/dL — AB (ref 8.9–10.3)
CHLORIDE: 105 mmol/L (ref 101–111)
CO2: 24 mmol/L (ref 22–32)
CREATININE: 0.78 mg/dL (ref 0.44–1.00)
Glucose, Bld: 158 mg/dL — ABNORMAL HIGH (ref 65–99)
Potassium: 3.7 mmol/L (ref 3.5–5.1)
Sodium: 137 mmol/L (ref 135–145)
Total Protein: 7.1 g/dL (ref 6.5–8.1)

## 2016-07-25 LAB — URINALYSIS, COMPLETE (UACMP) WITH MICROSCOPIC
BILIRUBIN URINE: NEGATIVE
Bacteria, UA: NONE SEEN
GLUCOSE, UA: NEGATIVE mg/dL
Hgb urine dipstick: NEGATIVE
Ketones, ur: NEGATIVE mg/dL
LEUKOCYTES UA: NEGATIVE
Nitrite: NEGATIVE
PH: 5 (ref 5.0–8.0)
Protein, ur: NEGATIVE mg/dL
RBC / HPF: NONE SEEN RBC/hpf (ref 0–5)
SPECIFIC GRAVITY, URINE: 1.008 (ref 1.005–1.030)
SQUAMOUS EPITHELIAL / LPF: NONE SEEN

## 2016-07-25 LAB — CBC
HCT: 30.6 % — ABNORMAL LOW (ref 35.0–47.0)
Hemoglobin: 9.6 g/dL — ABNORMAL LOW (ref 12.0–16.0)
MCH: 19.4 pg — ABNORMAL LOW (ref 26.0–34.0)
MCHC: 31.4 g/dL — ABNORMAL LOW (ref 32.0–36.0)
MCV: 61.8 fL — AB (ref 80.0–100.0)
PLATELETS: 215 10*3/uL (ref 150–440)
RBC: 4.95 MIL/uL (ref 3.80–5.20)
RDW: 20.9 % — AB (ref 11.5–14.5)
WBC: 8.9 10*3/uL (ref 3.6–11.0)

## 2016-07-25 LAB — IRON AND TIBC
Iron: 14 ug/dL — ABNORMAL LOW (ref 28–170)
Saturation Ratios: 4 % — ABNORMAL LOW (ref 10.4–31.8)
TIBC: 326 ug/dL (ref 250–450)
UIBC: 312 ug/dL

## 2016-07-25 MED ORDER — FERROUS SULFATE DRIED ER 160 (50 FE) MG PO TBCR: EXTENDED_RELEASE_TABLET | ORAL | 6 refills | Status: DC

## 2016-07-25 NOTE — ED Notes (Signed)
Pt given crackers, peanut butter and a cup of water. Family at bedside.

## 2016-07-25 NOTE — ED Provider Notes (Signed)
[facility] Emergency Department Provider Note       Time seen: ----------------------------------------- 11:44 AM on 07/25/2016 -----------------------------------------     I have reviewed the triage vital signs and the nursing notes.   HISTORY   Chief Complaint Altered Mental Status and Fall    HPI Jessica Webster is a 71 y.o. female who presents to the ED for confusion. Patient was sent here by the neurologists office for frequent falls. She recently has had neuro imaging done, family is concerned she may have a UTI or other source of infection. She is continued to have some low back pain and urinary urgency. She's had frequent falls over this past weekend. She has had some recent wheezing has been started on clonazepam for anxiety.   Past Medical History:  Diagnosis Date  . Alzheimer disease     There are no active problems to display for this patient.   Past Surgical History:  Procedure Laterality Date  . APPENDECTOMY    . NASAL SINUS SURGERY      Allergies Erythromycin and Sulfa antibiotics  Social History Social History  Substance Use Topics  . Smoking status: Former Games developer  . Smokeless tobacco: Former Neurosurgeon  . Alcohol use No    Review of Systems Constitutional: Negative for fever. Cardiovascular: Negative for chest pain. Respiratory: Positive for shortness of breath Gastrointestinal: Negative for abdominal pain, vomiting and diarrhea. Genitourinary: Negative for dysuria. Musculoskeletal: Negative for back pain. Skin: Negative for rash. Neurological: Negative for headaches, positive for confusion  10-point ROS otherwise negative.  ____________________________________________   PHYSICAL EXAM:  VITAL SIGNS: ED Triage Vitals  Enc Vitals Group     BP 07/25/16 0907 127/61     Pulse Rate 07/25/16 0907 88     Resp 07/25/16 0907 20     Temp 07/25/16 0907 98.4 F (36.9 C)     Temp Source 07/25/16 0907 Oral     SpO2 07/25/16  0907 97 %     Weight 07/25/16 0908 151 lb (68.5 kg)     Height --      Head Circumference --      Peak Flow --      Pain Score --      Pain Loc --      Pain Edu? --      Excl. in GC? --     Constitutional: Alert but disoriented. Well appearing and in no distress. Eyes: Conjunctivae are normal. PERRL. Normal extraocular movements. ENT   Head: Normocephalic and atraumatic.   Nose: No congestion/rhinnorhea.   Mouth/Throat: Mucous membranes are moist.   Neck: No stridor. Cardiovascular: Normal rate, regular rhythm. No murmurs, rubs, or gallops. Respiratory: Normal respiratory effort without tachypnea nor retractions. Breath sounds are clear and equal bilaterally. No wheezes/rales/rhonchi. Gastrointestinal: Soft and nontender. Normal bowel sounds Musculoskeletal: Nontender with normal range of motion in extremities. No lower extremity tenderness nor edema. Neurologic:  Normal speech and language. No gross focal neurologic deficits are appreciated.  Skin:  Skin is warm, dry and intact. No rash noted. Psychiatric: Mood and affect are normal. Speech and behavior are normal.  ____________________________________________  ED COURSE:  Pertinent labs & imaging results that were available during my care of the patient were reviewed by me and considered in my medical decision making (see chart for details). Patient presents for altered mental status, we will assess with labs and imaging as indicated. Clinical Course as of Jul 25 1524  Mon Jul 25, 2016  1339 Patient appears  to have iron deficiency anemia, we will start her on slow iron.  [JW]    Clinical Course User Index [JW] Emily Filbert, MD   Procedures ____________________________________________   LABS (pertinent positives/negatives)  Labs Reviewed  COMPREHENSIVE METABOLIC PANEL - Abnormal; Notable for the following:       Result Value   Glucose, Bld 158 (*)    Calcium 8.7 (*)    Albumin 3.3 (*)    Total  Bilirubin 0.2 (*)    All other components within normal limits  CBC - Abnormal; Notable for the following:    Hemoglobin 9.6 (*)    HCT 30.6 (*)    MCV 61.8 (*)    MCH 19.4 (*)    MCHC 31.4 (*)    RDW 20.9 (*)    All other components within normal limits  URINALYSIS, COMPLETE (UACMP) WITH MICROSCOPIC - Abnormal; Notable for the following:    Color, Urine YELLOW (*)    APPearance CLEAR (*)    All other components within normal limits  IRON AND TIBC - Abnormal; Notable for the following:    Iron 14 (*)    Saturation Ratios 4 (*)    All other components within normal limits    RADIOLOGY Images were viewed by me  Chest x-ray  IMPRESSION: No acute abnormality. Moderate hiatal hernia. ____________________________________________  FINAL ASSESSMENT AND PLAN  Altered mental status, dementia, anemia  Plan: Patient's labs and imaging were dictated above. Patient had presented for Worsening altered mental status and frequent falls. No specific etiology was found at this time. She has had extensive neurologic workup as an outpatient and family declined further imaging of her brain. She does have iron deficiency anemia and have started her on iron. They're encouraged to use Klonopin which she has already been prescribed as needed for agitation and anxiety.   Emily Filbert, MD   Note: This note was generated in part or whole with voice recognition software. Voice recognition is usually quite accurate but there are transcription errors that can and very often do occur. I apologize for any typographical errors that were not detected and corrected.     Emily Filbert, MD 07/25/16 [phone]

## 2016-07-25 NOTE — ED Triage Notes (Signed)
Pt brought in for further eval of increased confusion. Sent over per [employer]. Family reports frequent falls as well.

## 2016-07-25 NOTE — ED Triage Notes (Signed)
Pt here from [facility] urology, Dr Annia Belt called due to patient's increased confusion, lower back pain, frequency and urgency. Pt also has had multiple falls this weekend. Dr Annia Belt reports pt with hx of asthma and is audibly wheezing. BP 125/73, HR 89. Pt was recently started on clonazepam for anxiety.

## 2016-08-02 ENCOUNTER — Ambulatory Visit (INDEPENDENT_AMBULATORY_CARE_PROVIDER_SITE_OTHER): Payer: Medicare Other | Admitting: Pulmonary Disease

## 2016-08-02 ENCOUNTER — Encounter: Payer: Self-pay | Admitting: Pulmonary Disease

## 2016-08-02 VITALS — BP 124/78 | HR 94 | Wt 159.0 lb

## 2016-08-02 DIAGNOSIS — F0390 Unspecified dementia without behavioral disturbance: Secondary | ICD-10-CM | POA: Diagnosis not present

## 2016-08-02 DIAGNOSIS — J383 Other diseases of vocal cords: Secondary | ICD-10-CM

## 2016-08-02 DIAGNOSIS — R4189 Other symptoms and signs involving cognitive functions and awareness: Secondary | ICD-10-CM

## 2016-08-02 DIAGNOSIS — J4552 Severe persistent asthma with status asthmaticus: Secondary | ICD-10-CM | POA: Diagnosis not present

## 2016-08-02 DIAGNOSIS — F03C0 Unspecified dementia, severe, without behavioral disturbance, psychotic disturbance, mood disturbance, and anxiety: Secondary | ICD-10-CM

## 2016-08-02 MED ORDER — BUDESONIDE 0.5 MG/2ML IN SUSP: 0.5000 mg | Freq: Two times a day (BID) | RESPIRATORY_TRACT | 10 refills | Status: AC

## 2016-08-02 MED ORDER — BUDESONIDE 0.5 MG/2ML IN SUSP: 0.5000 mg | Freq: Two times a day (BID) | RESPIRATORY_TRACT | 10 refills | Status: DC

## 2016-08-02 MED ORDER — PREDNISONE 20 MG PO TABS: 40.0000 mg | ORAL_TABLET | Freq: Every day | ORAL | 0 refills | Status: AC

## 2016-08-02 MED ORDER — ARFORMOTEROL TARTRATE 15 MCG/2ML IN NEBU: 15.0000 ug | INHALATION_SOLUTION | Freq: Two times a day (BID) | RESPIRATORY_TRACT | 6 refills | Status: DC

## 2016-08-02 MED ORDER — ARFORMOTEROL TARTRATE 15 MCG/2ML IN NEBU: 15.0000 ug | INHALATION_SOLUTION | Freq: Two times a day (BID) | RESPIRATORY_TRACT | 6 refills | Status: AC

## 2016-08-02 NOTE — Progress Notes (Signed)
PULMONARY CONSULT NOTE  Requesting MD/Service: Burnett Sheng Date of initial consultation: 08/02/2016  Reason for consultation: wheezing  PT PROFILE: 71 y.o. female former smoker with advanced Alzheimer's dementia and longstanding asthma with several months of persistent wheezing.  HPI:  As above. Pt has severe dementia and history is provided by her husband and daughter. She has developed audible wheezing that persists through the day and night (loud enough to disturb her husband's sleep). She has been treated 6 times with antibiotics and prednisone in the past 4 months. Each time, there is improvement that lasts approx 7-10 days (at most) only to have symptoms return. She also has cough with scant white mucus. Pt is not very physically active and therefore, it is difficult to discern how much SOB accompanies these symptoms. She is maintained on a Symbicort inhaler that her husband assists her with and he admits that often, the mist clearly is not properly delivered to her lungs as she is unable to cooperate with the technique effectively. She has a nebulizer and receives Duoneb a couple or more times per day with transient improvement in her wheezing. On one occasion, pt's husband administered a small dose of alprazolam which helped the audible wheezes transiently.   Past Medical History:  Diagnosis Date  . Alzheimer disease   Asthma Sinusitis  Past Surgical History:  Procedure Laterality Date  . APPENDECTOMY    . NASAL SINUS SURGERY      MEDICATIONS: I have reviewed all medications and confirmed regimen as documented  Social History   Social History  . Marital status: Married    Spouse name: N/A  . Number of children: N/A  . Years of education: N/A   Occupational History  . Not on file.   Social History Main Topics  . Smoking status: Former Games developer  . Smokeless tobacco: Former Neurosurgeon  . Alcohol use No  . Drug use: No  . Sexual activity: Not on file   Other Topics Concern  . Not  on file   Social History Narrative  . No narrative on file    History reviewed. No pertinent family history.  ROS: No fever, myalgias/arthralgias, unexplained weight loss or weight gain No new focal weakness or sensory deficits No otalgia, hearing loss, visual changes, nasal and sinus symptoms, mouth and throat problems No neck pain or adenopathy No abdominal pain, N/V/D, diarrhea, change in bowel pattern No dysuria, change in urinary pattern   Vitals:   08/02/16 1133  BP: 124/78  Pulse: 94  SpO2: 98%  Weight: 72.1 kg (159 lb)     EXAM:  Gen: WDWN, Pleasant, poorly oriented, audible wheezes HEENT: NCAT, sclera white, oropharynx normal Neck: Supple without LAN, thyromegaly, JVD Lungs: prominent pseudowheeze over trachea, diffuse musical (bronchial) wheezes throughout Cardiovascular: RRR, no murmurs noted Abdomen: Soft, nontender, normal BS Ext: without clubbing, cyanosis, edema Neuro: CNs grossly intact, motor and sensory intact Skin: Limited exam, no lesions noted  DATA:   BMP Latest Ref Rng & Units 07/25/2016 05/05/2016  Glucose 65 - 99 mg/dL 956(O) 96  BUN 6 - 20 mg/dL 17 20  Creatinine 1.30 - 1.00 mg/dL 8.65 7.84  Sodium 696 - 145 mmol/L 137 137  Potassium 3.5 - 5.1 mmol/L 3.7 4.0  Chloride 101 - 111 mmol/L 105 104  CO2 22 - 32 mmol/L 24 26  Calcium 8.9 - 10.3 mg/dL 2.9(B) 2.8(U)    CBC Latest Ref Rng & Units 07/25/2016 05/05/2016  WBC 3.6 - 11.0 K/uL 8.9 9.0  Hemoglobin 12.0 -  16.0 g/dL 1.6(X) 10.0(L)  Hematocrit 35.0 - 47.0 % 30.6(L) 32.3(L)  Platelets 150 - 440 K/uL 215 199    CXR (07/25/16):  Small HH. NACPD  IMPRESSION:     ICD-9-CM ICD-10-CM   1. Severe persistent asthma with status asthmaticus 493.91 J45.52 Ambulatory Referral for DME  2. Cognitive impairment 294.9 R41.89   3. Advanced dementia 294.20 F03.90   4. Vocal cord dysfunction 478.5 J38.3    She has severe persistent asthma that has resisted treatment with ICS/LABA - likely because of  ineffective MDI technique (due to severe cognitive impairment). There is also a component of vocal cord dysfunction (as evidenced by pseudowheeze that resolved at least on one occasion with alprazolam). The difficulty with control might be in part due to GERD (HH noted on CXR) and sinusitis (husband noted improvement in her asthma many years ago after sinus surgery)  PLAN:  Prednisone 40 mg daily for 5 days Stop Symbicort Begin budesonide 0.5 mg and arformoterol nebulized twice a day Continue DuoNeb nebulized when necessary Follow-up in 4-6 weeks. Further evaluation could include CT scan of her sinuses and/or investigation of possible GERD   Billy Fischer, MD PCCM service Mobile [phone] Pager [phone] 08/02/2016

## 2016-08-02 NOTE — Patient Instructions (Addendum)
Stop Symbicort Begin budesonide and arfomoterol (2 ml each - they may be mixed) nebulized twice a day Prednisone 40 mg (2X 20 mg) daily for 5 days Follow up in 4-6 weeks

## 2016-08-12 ENCOUNTER — Telehealth: Payer: Self-pay | Admitting: *Deleted

## 2016-08-12 NOTE — Telephone Encounter (Signed)
Received paperwork from [facility] to fill out for pt's Budesonide and Brovana. Faxed to [facility] at [phone].   Pt ID: [id number]

## 2016-08-12 NOTE — Telephone Encounter (Signed)
Received fax back from [facility]. It states the neb medications need to be ran under Medicare Part B. [facility] and informed them. They state they can't run it under part B and that the pt/family will have to go elsewhere or pay out of pocket.   Informed husband of medication issue. Husband states they have received the medication from the DME pharmacy. Informed [place] pharmacy to disregard RX. Nothing further needed.

## 2016-08-24 ENCOUNTER — Other Ambulatory Visit: Payer: Self-pay | Admitting: *Deleted

## 2016-08-26 ENCOUNTER — Emergency Department
Admission: EM | Admit: 2016-08-26 | Discharge: 2016-08-26 | Disposition: A | Discharge status: Home / Self Care | Payer: Medicare Other | Attending: Emergency Medicine | Admitting: Emergency Medicine

## 2016-08-26 ENCOUNTER — Encounter: Payer: Self-pay | Admitting: Emergency Medicine

## 2016-08-26 ENCOUNTER — Emergency Department: Payer: Medicare Other

## 2016-08-26 DIAGNOSIS — Z79899 Other long term (current) drug therapy: Secondary | ICD-10-CM | POA: Insufficient documentation

## 2016-08-26 DIAGNOSIS — G309 Alzheimer's disease, unspecified: Secondary | ICD-10-CM | POA: Diagnosis not present

## 2016-08-26 DIAGNOSIS — R0602 Shortness of breath: Secondary | ICD-10-CM | POA: Diagnosis present

## 2016-08-26 DIAGNOSIS — J45901 Unspecified asthma with (acute) exacerbation: Secondary | ICD-10-CM | POA: Diagnosis not present

## 2016-08-26 DIAGNOSIS — Z87891 Personal history of nicotine dependence: Secondary | ICD-10-CM | POA: Diagnosis not present

## 2016-08-26 LAB — BASIC METABOLIC PANEL
ANION GAP: 8 (ref 5–15)
BUN: 15 mg/dL (ref 6–20)
CO2: 25 mmol/L (ref 22–32)
Calcium: 8.8 mg/dL — ABNORMAL LOW (ref 8.9–10.3)
Chloride: 103 mmol/L (ref 101–111)
Creatinine, Ser: 0.8 mg/dL (ref 0.44–1.00)
GFR calc Af Amer: >60 mL/min (ref >60)
GLUCOSE: 208 mg/dL — AB (ref 65–99)
POTASSIUM: 3.7 mmol/L (ref 3.5–5.1)
Sodium: 136 mmol/L (ref 135–145)

## 2016-08-26 LAB — CBC
HEMATOCRIT: 31.9 % — AB (ref 35.0–47.0)
HEMOGLOBIN: 9.9 g/dL — AB (ref 12.0–16.0)
MCH: 19 pg — AB (ref 26.0–34.0)
MCHC: 31 g/dL — AB (ref 32.0–36.0)
MCV: 61.2 fL — ABNORMAL LOW (ref 80.0–100.0)
Platelets: 234 10*3/uL (ref 150–440)
RBC: 5.2 MIL/uL (ref 3.80–5.20)
RDW: 21.4 % — ABNORMAL HIGH (ref 11.5–14.5)
WBC: 9.5 10*3/uL (ref 3.6–11.0)

## 2016-08-26 LAB — TROPONIN I: Troponin I: <0.03 ng/mL (ref <0.03)

## 2016-08-26 MED ORDER — IPRATROPIUM-ALBUTEROL 0.5-2.5 (3) MG/3ML IN SOLN
3.0000 mL | Freq: Once | RESPIRATORY_TRACT | Status: AC
  Administered 2016-08-26: 3 mL via RESPIRATORY_TRACT
  Filled 2016-08-26: qty 3

## 2016-08-26 MED ORDER — PREDNISONE 20 MG PO TABS: 40.0000 mg | ORAL_TABLET | Freq: Every day | ORAL | 0 refills | Status: DC

## 2016-08-26 MED ORDER — PREDNISONE 20 MG PO TABS
40.0000 mg | ORAL_TABLET | Freq: Once | ORAL | Status: AC
  Administered 2016-08-26: 40 mg via ORAL
  Filled 2016-08-26: qty 2

## 2016-08-26 NOTE — ED Triage Notes (Signed)
Pt to ed with c/o chest pain, sob, and headache today, pt has advanced dementia and is poor historian,  Pt husband with pt at triage.  EKG done and VSS.

## 2016-08-26 NOTE — ED Notes (Signed)
Pt sitting outside with family. Appears in no acute distress at this time.

## 2016-08-26 NOTE — Discharge Instructions (Signed)
We believe that your symptoms are caused today by an exacerbation of your asthma, or possibly related to your alzheimers causing some abnormal wheezing like sounds during breathing. Please take the prescribed medications and any medications that you have at home for your asthma including your nebulizers.    Follow up with your doctor as recommended.  If you develop any new or worsening symptoms, including but not limited to fever, persistent vomiting, worsening shortness of breath, or other symptoms that concern you, please return to the Emergency Department immediately.

## 2016-08-27 NOTE — ED Provider Notes (Signed)
[facility] Emergency Department Provider Note   ____________________________________________   First MD Initiated Contact with Patient 08/26/16 [phone]     (approximate)  I have reviewed the triage vital signs and the nursing notes.   HISTORY  Chief Complaint Chest Pain and Shortness of Breath  EM caveat: Patient has severe dementia to the point she can't even recognize her daughter, unable to obtain a relative history from her but history gathered is from the patient's husband and daughter at the bedside  HPI Jessica Webster is a 71 y.o. female here for evaluation for wheezing and "asthma"  Patient's husband reports that for the last several days patient is been using her inhaler, and she sees a pulmonologist and are not certain if she has asthma or if at times the patient breathes causing her to sound like she is wheezing, potentially from her upper throat area. For the last couple days she's had symptoms, and she he was called by her memory care that she had noted potentially having chest discomfort earlier today. Now they note that she has not been complaining of this since they started evaluating and brought her here. They report they brought her here to make sure her heart was okay  They have not as he changes in behavior, they have treated her several times with the same symptoms with prednisone and steroids, most recently about a month ago and see slight improvement, but most every day she seems to expands a slight wheezing when she's breathing  No fevers. No cough. She has not had any vomiting. No swelling in her legs  Past Medical History:  Diagnosis Date  . Alzheimer disease     There are no active problems to display for this patient.   Past Surgical History:  Procedure Laterality Date  . APPENDECTOMY    . NASAL SINUS SURGERY      Prior to Admission medications   Medication Sig Start Date End Date Taking? Authorizing Provider  ALPRAZolam  (XANAX) 0.25 MG tablet Take 0.25 mg by mouth 2 (two) times daily as needed. 07/29/16 08/28/16 Yes Historical Provider, MD  arformoterol (BROVANA) 15 MCG/2ML NEBU Take 2 mLs (15 mcg total) by nebulization 2 (two) times daily. 08/02/16  Yes Merwyn Katosavid B Simonds, MD  atorvastatin (LIPITOR) 20 MG tablet Take 20 mg by mouth at bedtime.   Yes Historical Provider, MD  benzonatate (TESSALON) 200 MG capsule Take 200 mg by mouth 3 (three) times daily as needed for cough.   Yes Historical Provider, MD  budesonide (PULMICORT) 0.5 MG/2ML nebulizer solution Take 2 mLs (0.5 mg total) by nebulization 2 (two) times daily. 08/02/16 08/02/17 Yes Merwyn Katosavid B Simonds, MD  cetirizine (ZYRTEC) 10 MG tablet Take 10 mg by mouth daily.   Yes Historical Provider, MD  cholecalciferol (VITAMIN D) 1000 units tablet Take 1,000 Units by mouth daily.   Yes Historical Provider, MD  clonazePAM (KLONOPIN) 0.5 MG tablet Take 0.25-0.5 mg by mouth 2 (two) times daily. tk 0.5 tab qam and 1 tab qhs 07/21/16  Yes Historical Provider, MD  cyclobenzaprine (FLEXERIL) 5 MG tablet Take 5 mg by mouth 2 (two) times daily.    Yes Historical Provider, MD  donepezil (ARICEPT) 10 MG tablet Take 10 mg by mouth every morning.    Yes Historical Provider, MD  ferrous sulfate (EQL SLOW RELEASE IRON) 160 (50 Fe) MG TBCR SR tablet To be taken every other day 07/25/16  Yes Emily FilbertJonathan E Williams, MD  fluticasone [facility](FLONASE) 50 MCG/ACT nasal  spray Place 2 sprays into both nostrils daily.   Yes Historical Provider, MD  ipratropium-albuterol (DUONEB) 0.5-2.5 (3) MG/3ML SOLN Inhale 3 mLs into the lungs every 4 (four) hours as needed. 04/21/16 04/16/17 Yes Historical Provider, MD  memantine (NAMENDA) 10 MG tablet Take 10 mg by mouth 2 (two) times daily.   Yes Historical Provider, MD  montelukast (SINGULAIR) 10 MG tablet Take 10 mg by mouth every morning.    Yes Historical Provider, MD  naproxen sodium (ANAPROX) 220 MG tablet Take 220 mg by mouth 2 (two) times daily with a meal.   Yes  Historical Provider, MD  nortriptyline (PAMELOR) 10 MG capsule Take 20 mg by mouth at bedtime.    Yes Historical Provider, MD  omeprazole (PRILOSEC) 20 MG capsule Take 20 mg by mouth daily.   Yes Historical Provider, MD  risperiDONE (RISPERDAL) 0.5 MG tablet Take 0.5 mg by mouth 2 (two) times daily.   Yes Historical Provider, MD  sertraline (ZOLOFT) 100 MG tablet Take 200 mg by mouth at bedtime.    Yes Historical Provider, MD  predniSONE (DELTASONE) 20 MG tablet Take 2 tablets (40 mg total) by mouth daily. 08/26/16   Sharyn Creamer, MD    Allergies Erythromycin and Sulfa antibiotics  History reviewed. No pertinent family history.  Social History Social History  Substance Use Topics  . Smoking status: Former Games developer  . Smokeless tobacco: Former Neurosurgeon  . Alcohol use No    Review of Systems EM caveat  Family reports patient has no history of heart disease ____________________________________________   PHYSICAL EXAM:  VITAL SIGNS: ED Triage Vitals  Enc Vitals Group     BP 08/26/16 1536 138/79     Pulse Rate 08/26/16 1536 93     Resp 08/26/16 1536 20     Temp 08/26/16 1536 98.2 F (36.8 C)     Temp Source 08/26/16 1536 Oral     SpO2 08/26/16 1536 99 %     Weight 08/26/16 1537 159 lb (72.1 kg)     Height --      Head Circumference --      Peak Flow --      Pain Score --      Pain Loc --      Pain Edu? --      Excl. in GC? --     Constitutional: Alert and oriented only to herself, does not recognize place or situation, also does not recognize her own daughter and family reports this is normal. Well appearing and in no acute distress. Eyes: Conjunctivae are normal. PERRL. EOMI. Head: Atraumatic. Nose: No congestion/rhinnorhea. Mouth/Throat: Mucous membranes are moist.  Oropharynx non-erythematous. Neck: No stridor.   Cardiovascular: Normal rate, regular rhythm. Grossly normal heart sounds.  Good peripheral circulation. Respiratory: Normal respiratory effort.  No retractions.  Lungs CTAB. She breathes with a normal respiratory pattern, but when she exhales seems to have a slight raspy component almost coming from the upper chest as opposed to true expiratory wheeze. Normal oxygen saturation. The expiratory wheeze seems to be somewhat intermittent in nature Gastrointestinal: Soft and nontender. Musculoskeletal: No lower extremity tenderness nor edema.  No joint effusions. Neurologic:  Somewhat slow in speech and language. No gross focal neurologic deficits are appreciated. Skin:  Skin is warm, dry and intact. No rash noted. Psychiatric: Mood and affect are rather flat. Behavior can be described as "pleasantly demented"  ____________________________________________   LABS (all labs ordered are listed, but only abnormal results are displayed)  Labs  Reviewed  BASIC METABOLIC PANEL - Abnormal; Notable for the following:       Result Value   Glucose, Bld 208 (*)    Calcium 8.8 (*)    All other components within normal limits  CBC - Abnormal; Notable for the following:    Hemoglobin 9.9 (*)    HCT 31.9 (*)    MCV 61.2 (*)    MCH 19.0 (*)    MCHC 31.0 (*)    RDW 21.4 (*)    All other components within normal limits  TROPONIN I  TROPONIN I   ____________________________________________  EKG  Reviewed and interpreted me at 2015 Heart rate 90 Care is 85 QTC 450 Normal sinus rhythm, normal T waves, no evidence of ischemia or ectopy Her initial triage EKG was also reviewed which was performed at 1535 Ventricular rate 100 Cure a 70 QTc 450 Some baseline artifact, likely normal EKG was slight artifact ____________________________________________  RADIOLOGY  Dg Chest 2 View  Result Date: 08/26/2016 CLINICAL DATA:  Wheezing and shortness of breath for over a month worse today, history bronchitis, asthma, hypertension, Alzheimer's EXAM: CHEST  2 VIEW COMPARISON:  07/25/2016 FINDINGS: Upper normal heart size. Hiatal hernia again identified. Mediastinal contours  and pulmonary vascularity otherwise normal. Mild chronic bronchitic changes. No acute infiltrate, pleural effusion or pneumothorax. Old RIGHT rib fractures. No acute bony abnormalities. IMPRESSION: Mild chronic bronchitic changes. Hiatal hernia. No acute abnormalities. Electronically Signed   By: Ulyses Southward M.D.   On: 08/26/2016 16:19    ____________________________________________   PROCEDURES  Procedure(s) performed: None  Procedures  Critical Care performed: No  ____________________________________________   INITIAL IMPRESSION / ASSESSMENT AND PLAN / ED COURSE  Pertinent labs & imaging results that were available during my care of the patient were reviewed by me and considered in my medical decision making (see chart for details).  Patient has for evaluation of possible chest discomfort earlier. EKG is very reassuring, and she is not having active discomfort now. She also has notable slight expiratory wheezing and almost sounds like an extra stridor. Not convincingly asthma, but family reports it has been treated as an outpatient the past with steroids and has nebulizers that she is at her memory care it seemed to improve symptoms. I will continue treatment with prednisone and they have albuterol nebulizers already which they don't need filled. She doesn't demonstrate a respiratory extremis or hypoxia. I discussed with the family including her husband potentially further evaluating for her wheezing including a CT to make sure she doesn't have any narrowing or "tumors" or "mass" or "cysts" on her vocal cords, but family reports that even if she did have a mass or tumor that they wouldn't likely pursue any treatment that would require any sort of surgery or radiation given the patient's current status and goals of care. I think this is reasonable, and he assured medical decision making we discussed that they may wish to set up an outpatient ENT follow-up, but in the meantime if the symptoms  worsen she can certainly come back.  She does not have a strong cardiac history. In given her age, and the somewhat unclear status whether she truly age. His chest discomfort today both troponins and her EKG her very reassuring. Patient's husband and her daughter, for returning her memory care. No significant change in her symptoms after nebulizers and prednisone, but she does not appear in distress, and I suspect there may be a behavioral component to some for wheezing. No follow-up  closely with primary care doctor, and careful return precautions advised with prednisone prescription given.      ____________________________________________   FINAL CLINICAL IMPRESSION(S) / ED DIAGNOSES  Final diagnoses:  Mild asthma exacerbation      NEW MEDICATIONS STARTED DURING THIS VISIT:  Discharge Medication List as of 08/26/2016 10:02 PM    START taking these medications   Details  predniSONE (DELTASONE) 20 MG tablet Take 2 tablets (40 mg total) by mouth daily., Starting Fri 08/26/2016, Print         Note:  This document was prepared using Dragon voice recognition software and may include unintentional dictation errors.     Sharyn Creamer, MD 08/27/16 [phone]

## 2016-09-01 ENCOUNTER — Ambulatory Visit (INDEPENDENT_AMBULATORY_CARE_PROVIDER_SITE_OTHER): Payer: Medicare Other | Admitting: Pulmonary Disease

## 2016-09-01 ENCOUNTER — Encounter: Payer: Self-pay | Admitting: Pulmonary Disease

## 2016-09-01 VITALS — BP 122/72 | HR 89 | Ht 67.0 in | Wt 159.0 lb

## 2016-09-01 DIAGNOSIS — J383 Other diseases of vocal cords: Secondary | ICD-10-CM | POA: Diagnosis not present

## 2016-09-01 DIAGNOSIS — J4540 Moderate persistent asthma, uncomplicated: Secondary | ICD-10-CM

## 2016-09-01 NOTE — Patient Instructions (Signed)
I strongly believe that Jessica Webster should remain on the nebulized steroids and bronchodilators (budesonide and arformoterol) as previously prescribed. It is unlikely that these medications are contributing much or at all to anxiety as there is very little systemic absorption of them. If the doctors at the Memory Care home have any questions or wish to discuss this with me, please ask them to contact me [phone].   Montelukast 10 mg daily Budesonide 0.5 mg nebulized twice a day Arformoterol 15 mcg nebulized twice a day Duoneb - up to every four hours as needed for wheezing or shortness of breath  Follow up in 4 weeks

## 2016-09-01 NOTE — Progress Notes (Signed)
PULMONARY OFFICE FOLLOW-UP NOTE  Requesting MD/Service: Burnett Sheng Date of initial consultation: 08/02/16 Reason for consultation: wheezing  PT PROFILE: 71 y.o. female former smoker with advanced Alzheimer's dementia and longstanding asthma with several months of persistent wheezing. On initial evaluation, it was felt that she was unable to effectively utilize metered dose inhalers. She was changed to nebulized steroid and long-acting beta agonist.  INTERVAL: Since last visit, she has moved into an assisted living/memory care home. The physicians servicing that facility felt that the nebulized medications were contributing to anxiety and they were discontinued.  SUBJ: She initially did well with nebulized budesonide and arformoterol. However these medications were discontinued by the physicians at the care facility at which she now resides. She has again developed problems with persistent wheezing and shortness of breath. On one occasion, she has been treated with prednisone since last visit. She is unable to voice any complaints due to advanced dementia. Her husband reports no fever, significant cough, purulent sputum, hemoptysis, chest pain.  OBJ:  Vitals:   09/01/16 0835 09/01/16 0836  BP:  122/72  Pulse:  89  SpO2:  99%  Weight: 159 lb (72.1 kg)   Height: 5\' 7"  (1.702 m)   Room air   EXAM:  Gen: WDWN, Pleasant, poorly oriented HEENT: NCAT, sclera white, oropharynx normal Neck: Supple without LAN, thyromegaly, JVD Lungs: Diffuse polyphonic wheezes in the chest, pseudowheeze over neck Cardiovascular: RRR, no murmurs noted Abdomen: Soft, nontender, normal BS Ext: Trace, symmetric ankle edema Neuro: CNs grossly intact, motor and sensory intact Skin: Limited exam, no lesions noted  DATA:   BMP Latest Ref Rng & Units 08/26/2016 07/25/2016 05/05/2016  Glucose 65 - 99 mg/dL 161(W) 960(A) 96  BUN 6 - 20 mg/dL 15 17 20   Creatinine 0.44 - 1.00 mg/dL 5.40 9.81 1.91  Sodium 135 - 145  mmol/L 136 137 137  Potassium 3.5 - 5.1 mmol/L 3.7 3.7 4.0  Chloride 101 - 111 mmol/L 103 105 104  CO2 22 - 32 mmol/L 25 24 26   Calcium 8.9 - 10.3 mg/dL 4.7(W) 2.9(F) 6.2(Z)    CBC Latest Ref Rng & Units 08/26/2016 07/25/2016 05/05/2016  WBC 3.6 - 11.0 K/uL 9.5 8.9 9.0  Hemoglobin 12.0 - 16.0 g/dL 3.0(Q) 6.5(H) 10.0(L)  Hematocrit 35.0 - 47.0 % 31.9(L) 30.6(L) 32.3(L)  Platelets 150 - 440 K/uL 234 215 199    CXR (08/26/16):  No significant change in small HH. NACPD  IMPRESSION:     ICD-9-CM ICD-10-CM   1. Severe persistent asthma, unspecified whether complicated 493.90 J45.40   2. Vocal cord dysfunction 478.5 J38.3   3. Very advanced dementia with inability to use MDIs effectively  She initially responded very well to the nebulized steroid/LABA. However, these medications were discontinued the cause of the perception that they might be contributing to her anxiety. I think this is unlikely to be a major contributor. However, if she gets treated with systemic steroids for asthma, that certainly could exacerbate her anxiety and any other behavioral problems. Therefore, I think it is very important for her to be maintained on the regimen as outlined last visit and again reviewed below  PLAN:  Resume prior regimen as follows: Montelukast 10 mg daily Budesonide 0.5 mg nebulized twice a day Arformoterol 15 mcg nebulized twice a day Duoneb - up to every four hours as needed for wheezing or shortness of breath  If there are any concerns regarding this regimen, please contact me using the numbers below  Follow up in 4  weeks for a quick recheck  Jessica Fischeravid Simonds, MD PCCM service Mobile [phone] Pager [phone] 09/01/2016 3:19 PM

## 2016-09-05 ENCOUNTER — Other Ambulatory Visit: Payer: Self-pay | Admitting: Pulmonary Disease

## 2016-09-09 ENCOUNTER — Ambulatory Visit: Payer: Medicare Other | Admitting: Pulmonary Disease

## 2016-09-14 ENCOUNTER — Telehealth: Payer: Self-pay | Admitting: *Deleted

## 2016-09-14 MED ORDER — IPRATROPIUM-ALBUTEROL 0.5-2.5 (3) MG/3ML IN SOLN: 3.0000 mL | RESPIRATORY_TRACT | 11 refills | Status: DC

## 2016-09-14 NOTE — Telephone Encounter (Signed)
Yes please

## 2016-09-14 NOTE — Telephone Encounter (Signed)
Nurse @ [facility] calling to report patient's inhalers d/c due to her not being able to use correctly. Patient takes Brovana bid, Duo neb every 4 hours prn and Pulmicort bid.  Patient is c/o sob and her anxiety level is high. Staff will contact on call doctor about increasing Xanax. Staff would like to know if there could be a change to Duo neb order from prn to scheduled.  Please advise.

## 2016-09-14 NOTE — Telephone Encounter (Signed)
Telephone Open    09/14/2016 [facility]  Merwyn KatosSimonds, David B, MD  Pulmonary Disease   Conversation  (Newest Message First)        09/14/16 2:42 PM  Sandre KittyGilley, Sabrina F routed this conversation to [facility] Triage  Coralee RudGilley, Sabrina F      09/14/16 2:39 PM  Note    Pt caretaker at [facility] is asking for a increase on pt medication. States pt is unable to do inhalers, and pulls off a mask for breathing treatments. Please call to discuss.    jennifer, brittany, or brandy  to [place], [place] F       09/14/16 2:39 PM   House  This encounter is not signed. The conversation may still be ongoing.  Additional Documentation   Encounter Info:   Billing Info,   History,   Allergies,   Detailed Report     Orders Placed    None  Medication Renewals and Changes     None    Medication List  Visit Diagnoses     None    Problem List

## 2016-09-14 NOTE — Telephone Encounter (Signed)
Updated medication list with provider signature to change  Duo Neb to every 4 hours. Faxed to [phone].

## 2016-09-16 ENCOUNTER — Telehealth: Payer: Self-pay | Admitting: Pulmonary Disease

## 2016-09-16 NOTE — Telephone Encounter (Signed)
[facility] states Pharmacy needs signed order for Albuterol nebulizer

## 2016-09-16 NOTE — Telephone Encounter (Signed)
Can we do an RX for Albuterol Neb? We don't have it on pt's med list.

## 2016-09-20 MED ORDER — IPRATROPIUM-ALBUTEROL 0.5-2.5 (3) MG/3ML IN SOLN: 3.0000 mL | RESPIRATORY_TRACT | 11 refills | Status: AC | PRN

## 2016-09-20 NOTE — Telephone Encounter (Signed)
Informed Clydie BraunKaren of DS' response. Nothing further needed.

## 2016-09-20 NOTE — Telephone Encounter (Signed)
She has Duoneb as her rescue medication. Albuterol is in Duoneb. Her Duoneb order seemed to indicate that she was to be on it q 4 hrs on a schedule. It should be q 4 hrs PRN. I have made that change in her plan.   Maintenance nebs: Budesonide BID Arformoterol BID  Rescue (as needed) neb: Duoneb q 4 hrs PRN  Thanks  Billy Fischeravid Dhalia Zingaro, MD PCCM service Mobile [phone] Pager [phone] 09/20/2016 4:01 PM

## 2016-09-26 ENCOUNTER — Telehealth: Payer: Self-pay | Admitting: Pulmonary Disease

## 2016-09-26 NOTE — Telephone Encounter (Signed)
Schedule patient with next available appt/provider for an acute visit.

## 2016-09-26 NOTE — Telephone Encounter (Signed)
Pt husband came by the office and states pt is at the [facility] and the Dr. There advised pt be seen before 10/19/2016, due to her breathing and coughing difficulties. Please call.

## 2016-10-03 ENCOUNTER — Encounter: Payer: Self-pay | Admitting: Pulmonary Disease

## 2016-10-03 ENCOUNTER — Ambulatory Visit (INDEPENDENT_AMBULATORY_CARE_PROVIDER_SITE_OTHER): Payer: Medicare Other | Admitting: Pulmonary Disease

## 2016-10-03 VITALS — BP 140/90 | HR 96

## 2016-10-03 DIAGNOSIS — R490 Dysphonia: Secondary | ICD-10-CM

## 2016-10-03 DIAGNOSIS — Z87891 Personal history of nicotine dependence: Secondary | ICD-10-CM

## 2016-10-03 DIAGNOSIS — J449 Chronic obstructive pulmonary disease, unspecified: Secondary | ICD-10-CM | POA: Diagnosis not present

## 2016-10-03 DIAGNOSIS — J45909 Unspecified asthma, uncomplicated: Secondary | ICD-10-CM

## 2016-10-03 DIAGNOSIS — J383 Other diseases of vocal cords: Secondary | ICD-10-CM

## 2016-10-03 NOTE — Patient Instructions (Signed)
Continue nebulized budesonide and arformoterol twice a day Continue DuoNeb as needed for wheezing, chest tightness, cough, increased shortness of breath Follow-up with me in 3 months

## 2016-10-03 NOTE — Progress Notes (Signed)
PULMONARY OFFICE FOLLOW-UP NOTE  Requesting MD/Service: Burnett ShengHedrick Date of initial consultation: 08/02/16 Reason for consultation: wheezing  PT PROFILE: 71 y.o. female former smoker with advanced Alzheimer's dementia and longstanding asthma with several months of persistent wheezing. On initial evaluation, it was felt that she was unable to effectively utilize metered dose inhalers. She was changed to nebulized steroid and long-acting beta agonist.  INTERVAL: Treated for bronchitis/increased cough last week with abx and prednisone, then for UTI, then for yeast overgrowth.  SUBJ: Routine re-eval. Husband and daughter relate 2 bouts of abx for bronchitis and UTI. Also received prednisone for wheezing and fluconazole for yeast overgrowth after abx/steroids. Now respiratory status is improved but she has severe hoarseness and episodic stridor/pseudowheezes.   OBJ:  Vitals:   10/03/16 1344  BP: 140/90  Pulse: 96  SpO2: 97%  Room air   EXAM:  Gen: WDWN, poorly oriented, NAD, hoarse voice quality HEENT: NCAT, sclera white, oropharynx normal Neck: Supple without LAN, thyromegaly, JVD, no stridor Lungs: no wheezes or other adventitious sounds Cardiovascular: Reg, no murmurs noted Abdomen: Soft, nontender, normal BS Ext: 1+ symmetric ankle edema Neuro: CNs grossly intact, motor and sensory intact Skin: Limited exam, no lesions noted  DATA:   BMP Latest Ref Rng & Units 08/26/2016 07/25/2016 05/05/2016  Glucose 65 - 99 mg/dL 409(W208(H) 119(J158(H) 96  BUN 6 - 20 mg/dL 15 17 20   Creatinine 0.44 - 1.00 mg/dL 4.780.80 2.950.78 6.210.78  Sodium 135 - 145 mmol/L 136 137 137  Potassium 3.5 - 5.1 mmol/L 3.7 3.7 4.0  Chloride 101 - 111 mmol/L 103 105 104  CO2 22 - 32 mmol/L 25 24 26   Calcium 8.9 - 10.3 mg/dL 3.0(Q8.8(L) 6.5(H8.7(L) 8.4(O8.5(L)    CBC Latest Ref Rng & Units 08/26/2016 07/25/2016 05/05/2016  WBC 3.6 - 11.0 K/uL 9.5 8.9 9.0  Hemoglobin 12.0 - 16.0 g/dL 9.6(E9.9(L) 9.5(M9.6(L) 10.0(L)  Hematocrit 35.0 - 47.0 % 31.9(L) 30.6(L)  32.3(L)  Platelets 150 - 440 K/uL 234 215 199    CXR:  NNF  IMPRESSION:     ICD-10-CM   1. Chronic obstructive pulmonary disease, unspecified COPD type (HCC) J44.9   2. Asthma J45.909   3. Former smoker Z87.891   4. Hoarseness R49.0   5. Suspected vocal cord dysfunction J38.3   6. Very advanced dementia with inability to use MDIs effectively    PLAN:  Cont prior regimen as follows: Montelukast 10 mg daily Budesonide 0.5 mg nebulized twice a day Arformoterol 15 mcg nebulized twice a day Duoneb - up to every four hours as needed for wheezing or shortness of breath   Follow up in 3 months or PRN  Billy Fischeravid Simonds, MD PCCM service Mobile [phone] Pager [phone] 10/04/2016 7:28 PM

## 2016-10-11 ENCOUNTER — Telehealth: Payer: Self-pay | Admitting: Pulmonary Disease

## 2016-10-11 MED ORDER — PREDNISONE 10 MG PO TABS: 10.0000 mg | ORAL_TABLET | Freq: Every day | ORAL | 0 refills | Status: DC

## 2016-10-11 NOTE — Telephone Encounter (Signed)
Prednisone 10 mg until she sees Dr Sung AmabileSImonds

## 2016-10-11 NOTE — Telephone Encounter (Signed)
DS pt last seen 10/03/16. Husband states pt finished last prednisone 10/02/16. He states  She having a cough with clear mucus and is fatigued. Pt takes Budesonide and Arformoterol BID and Duoneb Q4hrs PRN. Pt is scheduled to see DS on 10/20/16. Please advise.

## 2016-10-11 NOTE — Telephone Encounter (Signed)
Pt spouse calling stating pt had bronchitis again  Would like some medication please called in  Please advise

## 2016-10-11 NOTE — Telephone Encounter (Signed)
Spoke with husband and informed him that per DK we are sending in Prednisone 10mg  daily until her appt on 10/20/16. Husband verbalized understanding. RX sent. Nothing further needed.

## 2016-10-19 ENCOUNTER — Ambulatory Visit: Payer: Medicare Other | Admitting: Pulmonary Disease

## 2016-10-20 ENCOUNTER — Encounter: Payer: Self-pay | Admitting: Pulmonary Disease

## 2016-10-20 ENCOUNTER — Ambulatory Visit (INDEPENDENT_AMBULATORY_CARE_PROVIDER_SITE_OTHER): Payer: Medicare Other | Admitting: Pulmonary Disease

## 2016-10-20 VITALS — BP 120/68 | HR 76 | Ht 67.0 in | Wt 151.0 lb

## 2016-10-20 DIAGNOSIS — J449 Chronic obstructive pulmonary disease, unspecified: Secondary | ICD-10-CM | POA: Diagnosis not present

## 2016-10-20 MED ORDER — PREDNISONE 5 MG PO TABS: 5.0000 mg | ORAL_TABLET | Freq: Every day | ORAL | 5 refills | Status: AC

## 2016-10-20 NOTE — Patient Instructions (Signed)
Continue budesonide, arformoterol, montelukast, DuoNeb as previously prescribed We will keep her on low-dose chronic daily prednisone at 5 mg per day Follow-up has already been scheduled for September 24th

## 2016-10-21 NOTE — Progress Notes (Signed)
PULMONARY OFFICE FOLLOW-UP NOTE  Requesting MD/Service: Burnett ShengHedrick Date of initial consultation: 08/02/16 Reason for consultation: wheezing  PT PROFILE: 71 y.o. female former smoker with advanced Alzheimer's dementia and longstanding asthma with several months of persistent wheezing. On initial evaluation, it was felt that she was unable to effectively utilize metered dose inhalers. She was changed to nebulized steroid and long-acting beta agonist.  INTERVAL: Treated last week with prednisone for increased wheezing  SUBJ: This is an add on visit at the request of the patient's husband. He notes that she had another occurrence of increased wheezing and was treated with prednisone. Her wheezing improved dramatically but is now relapsing since she has discontinued the prednisone. He asks specifically whether she can be on some modest dose of long-term prednisone.  OBJ:  Vitals:   10/20/16 0940 10/20/16 0945  BP:  120/68  Pulse:  76  SpO2:  95%  Weight: 151 lb (68.5 kg)   Height: 5\' 7"  (1.702 m)   Room air   EXAM:  Gen: Poorly oriented, NAD, hoarse voice quality HEENT: NCAT, sclera white, oropharynx normal Neck: Supple without LAN, JVD Lungs: no wheezes or other adventitious sounds Cardiovascular: Reg, no murmurs noted Abdomen: Soft, nontender, normal BS Ext: Trace symmetric ankle edema Neuro: CNs grossly intact, motor and sensory intact  DATA:   BMP Latest Ref Rng & Units 08/26/2016 07/25/2016 05/05/2016  Glucose 65 - 99 mg/dL 161(W208(H) 960(A158(H) 96  BUN 6 - 20 mg/dL 15 17 20   Creatinine 0.44 - 1.00 mg/dL 5.400.80 9.810.78 1.910.78  Sodium 135 - 145 mmol/L 136 137 137  Potassium 3.5 - 5.1 mmol/L 3.7 3.7 4.0  Chloride 101 - 111 mmol/L 103 105 104  CO2 22 - 32 mmol/L 25 24 26   Calcium 8.9 - 10.3 mg/dL 4.7(W8.8(L) 2.9(F8.7(L) 6.2(Z8.5(L)    CBC Latest Ref Rng & Units 08/26/2016 07/25/2016 05/05/2016  WBC 3.6 - 11.0 K/uL 9.5 8.9 9.0  Hemoglobin 12.0 - 16.0 g/dL 3.0(Q9.9(L) 6.5(H9.6(L) 10.0(L)  Hematocrit 35.0 - 47.0 % 31.9(L)  30.6(L) 32.3(L)  Platelets 150 - 440 K/uL 234 215 199    CXR:  NNF  IMPRESSION:     ICD-10-CM   1. Chronic obstructive pulmonary disease, unspecified COPD type (HCC) J44.9   2. Chronic obstructive asthma (HCC) J44.9   3. Very advanced dementia       PLAN:  Cont prior regimen as follows: Montelukast 10 mg daily Budesonide 0.5 mg nebulized twice a day Arformoterol 15 mcg nebulized twice a day Duoneb - up to every four hours as needed for wheezing or shortness of breath  Add prednisone 5 mg daily   Follow up has already been scheduled 01/16/17  Billy Fischeravid Laylynn Campanella, MD PCCM service Mobile [phone] Pager [phone] 10/21/2016 2:04 PM

## 2016-11-01 ENCOUNTER — Observation Stay
Admission: EM | Admit: 2016-11-01 | Discharge: 2016-11-04 | Disposition: A | Discharge status: Skilled Nursing Facility | Payer: Medicare Other | Attending: Internal Medicine | Admitting: Internal Medicine

## 2016-11-01 ENCOUNTER — Encounter: Payer: Self-pay | Admitting: Emergency Medicine

## 2016-11-01 ENCOUNTER — Emergency Department: Payer: Medicare Other

## 2016-11-01 ENCOUNTER — Observation Stay: Payer: Medicare Other

## 2016-11-01 DIAGNOSIS — Z7952 Long term (current) use of systemic steroids: Secondary | ICD-10-CM | POA: Diagnosis not present

## 2016-11-01 DIAGNOSIS — Z66 Do not resuscitate: Secondary | ICD-10-CM | POA: Diagnosis not present

## 2016-11-01 DIAGNOSIS — Z79899 Other long term (current) drug therapy: Secondary | ICD-10-CM | POA: Insufficient documentation

## 2016-11-01 DIAGNOSIS — Z882 Allergy status to sulfonamides status: Secondary | ICD-10-CM | POA: Insufficient documentation

## 2016-11-01 DIAGNOSIS — Z87891 Personal history of nicotine dependence: Secondary | ICD-10-CM | POA: Insufficient documentation

## 2016-11-01 DIAGNOSIS — Z7951 Long term (current) use of inhaled steroids: Secondary | ICD-10-CM | POA: Diagnosis not present

## 2016-11-01 DIAGNOSIS — I10 Essential (primary) hypertension: Secondary | ICD-10-CM | POA: Diagnosis not present

## 2016-11-01 DIAGNOSIS — Z993 Dependence on wheelchair: Secondary | ICD-10-CM | POA: Diagnosis not present

## 2016-11-01 DIAGNOSIS — G9340 Encephalopathy, unspecified: Secondary | ICD-10-CM | POA: Diagnosis not present

## 2016-11-01 DIAGNOSIS — Z7982 Long term (current) use of aspirin: Secondary | ICD-10-CM | POA: Insufficient documentation

## 2016-11-01 DIAGNOSIS — R569 Unspecified convulsions: Secondary | ICD-10-CM

## 2016-11-01 DIAGNOSIS — F0281 Dementia in other diseases classified elsewhere with behavioral disturbance: Secondary | ICD-10-CM | POA: Diagnosis not present

## 2016-11-01 DIAGNOSIS — J449 Chronic obstructive pulmonary disease, unspecified: Secondary | ICD-10-CM | POA: Insufficient documentation

## 2016-11-01 DIAGNOSIS — G40909 Epilepsy, unspecified, not intractable, without status epilepticus: Principal | ICD-10-CM | POA: Insufficient documentation

## 2016-11-01 DIAGNOSIS — E785 Hyperlipidemia, unspecified: Secondary | ICD-10-CM | POA: Diagnosis not present

## 2016-11-01 DIAGNOSIS — K219 Gastro-esophageal reflux disease without esophagitis: Secondary | ICD-10-CM | POA: Diagnosis not present

## 2016-11-01 DIAGNOSIS — F419 Anxiety disorder, unspecified: Secondary | ICD-10-CM | POA: Insufficient documentation

## 2016-11-01 DIAGNOSIS — G309 Alzheimer's disease, unspecified: Secondary | ICD-10-CM | POA: Insufficient documentation

## 2016-11-01 HISTORY — DX: Anxiety disorder, unspecified: F41.9

## 2016-11-01 HISTORY — DX: Unspecified asthma, uncomplicated: J45.909

## 2016-11-01 HISTORY — DX: Essential (primary) hypertension: I10

## 2016-11-01 LAB — COMPREHENSIVE METABOLIC PANEL
ALBUMIN: 3.8 g/dL (ref 3.5–5.0)
ALT: 25 U/L (ref 14–54)
AST: 36 U/L (ref 15–41)
Alkaline Phosphatase: 90 U/L (ref 38–126)
Anion gap: 15 (ref 5–15)
BILIRUBIN TOTAL: 0.5 mg/dL (ref 0.3–1.2)
BUN: 16 mg/dL (ref 6–20)
CO2: 24 mmol/L (ref 22–32)
Calcium: 9.5 mg/dL (ref 8.9–10.3)
Chloride: 99 mmol/L — ABNORMAL LOW (ref 101–111)
Creatinine, Ser: 1 mg/dL (ref 0.44–1.00)
GFR calc Af Amer: >60 mL/min (ref >60)
GFR calc non Af Amer: 55 mL/min — ABNORMAL LOW (ref >60)
GLUCOSE: 121 mg/dL — AB (ref 65–99)
POTASSIUM: 3.7 mmol/L (ref 3.5–5.1)
Sodium: 138 mmol/L (ref 135–145)
TOTAL PROTEIN: 7.5 g/dL (ref 6.5–8.1)

## 2016-11-01 LAB — URINALYSIS, COMPLETE (UACMP) WITH MICROSCOPIC
BILIRUBIN URINE: NEGATIVE
Bacteria, UA: NONE SEEN
GLUCOSE, UA: NEGATIVE mg/dL
HGB URINE DIPSTICK: NEGATIVE
KETONES UR: NEGATIVE mg/dL
LEUKOCYTES UA: NEGATIVE
NITRITE: NEGATIVE
PH: 6 (ref 5.0–8.0)
Protein, ur: NEGATIVE mg/dL
Specific Gravity, Urine: 1.014 (ref 1.005–1.030)
Squamous Epithelial / LPF: NONE SEEN

## 2016-11-01 LAB — APTT: aPTT: 24 seconds (ref 24–36)

## 2016-11-01 LAB — CBC
HEMATOCRIT: 36.6 % (ref 35.0–47.0)
HEMOGLOBIN: 11.4 g/dL — AB (ref 12.0–16.0)
MCH: 20.4 pg — ABNORMAL LOW (ref 26.0–34.0)
MCHC: 31 g/dL — ABNORMAL LOW (ref 32.0–36.0)
MCV: 65.7 fL — ABNORMAL LOW (ref 80.0–100.0)
Platelets: 251 10*3/uL (ref 150–440)
RBC: 5.57 MIL/uL — ABNORMAL HIGH (ref 3.80–5.20)
RDW: 28.8 % — AB (ref 11.5–14.5)
WBC: 7.5 10*3/uL (ref 3.6–11.0)

## 2016-11-01 LAB — MAGNESIUM: MAGNESIUM: 2.3 mg/dL (ref 1.7–2.4)

## 2016-11-01 LAB — TROPONIN I: Troponin I: <0.03 ng/mL (ref <0.03)

## 2016-11-01 LAB — PROTIME-INR
INR: 1.06
Prothrombin Time: 13.8 seconds (ref 11.4–15.2)

## 2016-11-01 LAB — GLUCOSE, CAPILLARY: Glucose-Capillary: 99 mg/dL (ref 65–99)

## 2016-11-01 LAB — TSH: TSH: 1.362 u[IU]/mL (ref 0.350–4.500)

## 2016-11-01 LAB — MRSA PCR SCREENING: MRSA by PCR: POSITIVE — AB

## 2016-11-01 MED ORDER — ONDANSETRON HCL 4 MG/2ML IJ SOLN: 4.0000 mg | Freq: Four times a day (QID) | INTRAMUSCULAR | Status: DC | PRN

## 2016-11-01 MED ORDER — CYCLOBENZAPRINE HCL 10 MG PO TABS
5.0000 mg | ORAL_TABLET | Freq: Two times a day (BID) | ORAL | Status: DC
  Administered 2016-11-01 – 2016-11-04 (×5): 5 mg via ORAL
  Filled 2016-11-01 (×5): qty 1

## 2016-11-01 MED ORDER — POLYETHYLENE GLYCOL 3350 17 G PO PACK
17.0000 g | PACK | Freq: Every day | ORAL | Status: DC | PRN
  Administered 2016-11-03: 17 g via ORAL
  Filled 2016-11-01: qty 1

## 2016-11-01 MED ORDER — PREDNISONE 10 MG PO TABS
5.0000 mg | ORAL_TABLET | Freq: Every day | ORAL | Status: DC
  Administered 2016-11-02 – 2016-11-04 (×3): 5 mg via ORAL
  Filled 2016-11-01 (×3): qty 1

## 2016-11-01 MED ORDER — GADOBENATE DIMEGLUMINE 529 MG/ML IV SOLN
15.0000 mL | Freq: Once | INTRAVENOUS | Status: AC | PRN
  Administered 2016-11-01: 14 mL via INTRAVENOUS

## 2016-11-01 MED ORDER — SERTRALINE HCL 50 MG PO TABS
100.0000 mg | ORAL_TABLET | Freq: Every day | ORAL | Status: DC
  Administered 2016-11-02 – 2016-11-03 (×2): 100 mg via ORAL
  Filled 2016-11-01 (×2): qty 2

## 2016-11-01 MED ORDER — SACCHAROMYCES BOULARDII 250 MG PO CAPS
250.0000 mg | ORAL_CAPSULE | Freq: Two times a day (BID) | ORAL | Status: DC
  Administered 2016-11-01 – 2016-11-04 (×6): 250 mg via ORAL
  Filled 2016-11-01 (×6): qty 1

## 2016-11-01 MED ORDER — MONTELUKAST SODIUM 10 MG PO TABS
10.0000 mg | ORAL_TABLET | Freq: Every morning | ORAL | Status: DC
  Administered 2016-11-02 – 2016-11-04 (×3): 10 mg via ORAL
  Filled 2016-11-01 (×3): qty 1

## 2016-11-01 MED ORDER — ONDANSETRON HCL 4 MG PO TABS: 4.0000 mg | ORAL_TABLET | Freq: Four times a day (QID) | ORAL | Status: DC | PRN

## 2016-11-01 MED ORDER — MEMANTINE HCL 5 MG PO TABS
10.0000 mg | ORAL_TABLET | Freq: Two times a day (BID) | ORAL | Status: DC
  Administered 2016-11-01 – 2016-11-04 (×6): 10 mg via ORAL
  Filled 2016-11-01 (×6): qty 2

## 2016-11-01 MED ORDER — ALPRAZOLAM 0.5 MG PO TABS
0.2500 mg | ORAL_TABLET | Freq: Every day | ORAL | Status: DC
  Administered 2016-11-01: 0.25 mg via ORAL
  Filled 2016-11-01: qty 1

## 2016-11-01 MED ORDER — IPRATROPIUM-ALBUTEROL 0.5-2.5 (3) MG/3ML IN SOLN: 3.0000 mL | RESPIRATORY_TRACT | Status: DC | PRN

## 2016-11-01 MED ORDER — LORAZEPAM 2 MG/ML IJ SOLN: 1.0000 mg | INTRAMUSCULAR | Status: DC | PRN

## 2016-11-01 MED ORDER — FERROUS SULFATE 325 (65 FE) MG PO TABS
325.0000 mg | ORAL_TABLET | Freq: Every day | ORAL | Status: DC
  Administered 2016-11-02 – 2016-11-04 (×3): 325 mg via ORAL
  Filled 2016-11-01 (×3): qty 1

## 2016-11-01 MED ORDER — ASPIRIN EC 81 MG PO TBEC
81.0000 mg | DELAYED_RELEASE_TABLET | Freq: Every day | ORAL | Status: DC
  Administered 2016-11-02 – 2016-11-04 (×3): 81 mg via ORAL
  Filled 2016-11-01 (×3): qty 1

## 2016-11-01 MED ORDER — BUDESONIDE 0.5 MG/2ML IN SUSP
0.5000 mg | Freq: Two times a day (BID) | RESPIRATORY_TRACT | Status: DC
  Administered 2016-11-02 – 2016-11-04 (×5): 0.5 mg via RESPIRATORY_TRACT
  Filled 2016-11-01 (×5): qty 2

## 2016-11-01 MED ORDER — FLUTICASONE PROPIONATE 50 MCG/ACT NA SUSP
2.0000 | Freq: Every day | NASAL | Status: DC
  Administered 2016-11-02 – 2016-11-04 (×3): 2 via NASAL
  Filled 2016-11-01 (×2): qty 16

## 2016-11-01 MED ORDER — DONEPEZIL HCL 5 MG PO TABS
10.0000 mg | ORAL_TABLET | Freq: Every morning | ORAL | Status: DC
  Administered 2016-11-02 – 2016-11-04 (×3): 10 mg via ORAL
  Filled 2016-11-01 (×3): qty 2

## 2016-11-01 MED ORDER — SODIUM CHLORIDE 0.9 % IV BOLUS (SEPSIS)
1000.0000 mL | Freq: Once | INTRAVENOUS | Status: AC
  Administered 2016-11-01: 1000 mL via INTRAVENOUS

## 2016-11-01 MED ORDER — HYDROCHLOROTHIAZIDE 25 MG PO TABS
25.0000 mg | ORAL_TABLET | Freq: Every day | ORAL | Status: DC
  Administered 2016-11-02: 25 mg via ORAL
  Filled 2016-11-01: qty 1

## 2016-11-01 MED ORDER — ACETAMINOPHEN 325 MG PO TABS: 650.0000 mg | ORAL_TABLET | Freq: Four times a day (QID) | ORAL | Status: DC | PRN

## 2016-11-01 MED ORDER — RISPERIDONE 0.5 MG PO TABS
0.5000 mg | ORAL_TABLET | Freq: Two times a day (BID) | ORAL | Status: DC
  Administered 2016-11-01 – 2016-11-04 (×6): 0.5 mg via ORAL
  Filled 2016-11-01 (×2): qty 1
  Filled 2016-11-01 (×6): qty 0.5

## 2016-11-01 MED ORDER — ATORVASTATIN CALCIUM 20 MG PO TABS
20.0000 mg | ORAL_TABLET | Freq: Every day | ORAL | Status: DC
  Administered 2016-11-02 – 2016-11-03 (×2): 20 mg via ORAL
  Filled 2016-11-01 (×2): qty 1

## 2016-11-01 MED ORDER — ENOXAPARIN SODIUM 40 MG/0.4ML ~~LOC~~ SOLN
40.0000 mg | SUBCUTANEOUS | Status: DC
  Administered 2016-11-01 – 2016-11-03 (×3): 40 mg via SUBCUTANEOUS
  Filled 2016-11-01 (×3): qty 0.4

## 2016-11-01 MED ORDER — ARFORMOTEROL TARTRATE 15 MCG/2ML IN NEBU
15.0000 ug | INHALATION_SOLUTION | Freq: Two times a day (BID) | RESPIRATORY_TRACT | Status: DC
  Administered 2016-11-01 – 2016-11-04 (×6): 15 ug via RESPIRATORY_TRACT
  Filled 2016-11-01 (×7): qty 2

## 2016-11-01 MED ORDER — ACETAMINOPHEN 650 MG RE SUPP: 650.0000 mg | Freq: Four times a day (QID) | RECTAL | Status: DC | PRN

## 2016-11-01 MED ORDER — FUROSEMIDE 40 MG PO TABS
20.0000 mg | ORAL_TABLET | Freq: Every day | ORAL | Status: DC
  Administered 2016-11-02 – 2016-11-04 (×3): 20 mg via ORAL
  Filled 2016-11-01 (×3): qty 1

## 2016-11-01 NOTE — ED Triage Notes (Signed)
Pt to ED via EMS from [facility] for witnessed seizure. Per EMS no hx of seizures. Pt is postictal at this time, VS stable. No meds given per EMS. Pt hx of dementia, per EMS facility states pt is alert and oriented at times and follows simple commands at baseline.

## 2016-11-01 NOTE — ED Notes (Signed)
Family at bedside. 

## 2016-11-01 NOTE — ED Provider Notes (Signed)
[facility] Emergency Department Provider Note   ____________________________________________    I have reviewed the triage vital signs and the nursing notes.   HISTORY  Chief Complaint Seizures     HPI Jessica Webster is a 71 y.o. female with a history of Alzheimer's dementia who presentsafter witnessed seizure. Patient presents from her nursing facility, reportedly she had a generalized tonic-clonic seizure with loss of bladder continence. She has no history of seizures in the past. No fevers reported. No vomiting reported. History is severely limited by dementia and likely postictal state   Past Medical History:  Diagnosis Date  . Alzheimer disease   . Anxiety   . Asthma   . Hypertension     There are no active problems to display for this patient.   Past Surgical History:  Procedure Laterality Date  . APPENDECTOMY    . NASAL SINUS SURGERY      Prior to Admission medications   Medication Sig Start Date End Date Taking? Authorizing Provider  ALPRAZolam (XANAX) 0.25 MG tablet Take 0.25 mg by mouth 3 (three) times daily.   Yes [provider]  arformoterol (BROVANA) 15 MCG/2ML NEBU Take 2 mLs (15 mcg total) by nebulization 2 (two) times daily. 08/02/16  Yes Merwyn Katos, MD  aspirin EC 81 MG tablet Take 81 mg by mouth daily.   Yes [provider]  atorvastatin (LIPITOR) 20 MG tablet Take 20 mg by mouth at bedtime.   Yes [provider]  cyclobenzaprine (FLEXERIL) 5 MG tablet Take 5 mg by mouth 2 (two) times daily.    Yes [provider]  donepezil (ARICEPT) 10 MG tablet Take 10 mg by mouth every morning.    Yes [provider]  ferrous sulfate 325 (65 FE) MG tablet Take 325 mg by mouth daily with breakfast.   Yes [provider]  fluticasone (FLONASE) 50 MCG/ACT nasal spray Place 2 sprays into both nostrils daily.   Yes [provider]  furosemide (LASIX) 20 MG tablet Take 20 mg  by mouth.   Yes [provider]  hydrochlorothiazide (HYDRODIURIL) 12.5 MG tablet Take 25 mg by mouth daily.   Yes [provider]  loperamide (IMODIUM) 2 MG capsule Take 2 mg by mouth as needed for diarrhea or loose stools.   Yes [provider]  memantine (NAMENDA) 10 MG tablet Take 10 mg by mouth 2 (two) times daily.   Yes [provider]  montelukast (SINGULAIR) 10 MG tablet Take 10 mg by mouth every morning.    Yes [provider]  nortriptyline (PAMELOR) 10 MG capsule Take 20 mg by mouth at bedtime.    Yes [provider]  omeprazole (PRILOSEC) 20 MG capsule Take 20 mg by mouth daily.   Yes [provider]  predniSONE (DELTASONE) 5 MG tablet Take 1 tablet (5 mg total) by mouth daily with breakfast. 10/20/16  Yes Merwyn Katos, MD  risperiDONE (RISPERDAL) 0.5 MG tablet Take 0.5 mg by mouth 2 (two) times daily.   Yes [provider]  saccharomyces boulardii (FLORASTOR) 250 MG capsule Take 250 mg by mouth 2 (two) times daily.   Yes [provider]  sertraline (ZOLOFT) 100 MG tablet Take 100 mg by mouth at bedtime.    Yes [provider]  budesonide (PULMICORT) 0.5 MG/2ML nebulizer solution Take 2 mLs (0.5 mg total) by nebulization 2 (two) times daily. 08/02/16 08/02/17  Merwyn Katos, MD  ipratropium-albuterol (DUONEB) 0.5-2.5 (3)  MG/3ML SOLN Inhale 3 mLs into the lungs every 4 (four) hours as needed. 09/20/16 09/15/17  Merwyn KatosSimonds, David B, MD  polyethylene glycol (MIRALAX / Ethelene HalGLYCOLAX) packet Take 17 g by mouth daily as needed.     [provider]     Allergies Erythromycin and Sulfa antibiotics  No family history on file.  Social History Social History  Substance Use Topics  . Smoking status: Former Games developermoker  . Smokeless tobacco: Former NeurosurgeonUser  . Alcohol use No    Level V caveat: Unable to obtain Review of Systems due to altered mental status likely postictal state and  dementia    ____________________________________________   PHYSICAL EXAM:  VITAL SIGNS: ED Triage Vitals  Enc Vitals Group     BP 11/01/16 1330 110/69     Pulse Rate 11/01/16 1330 66     Resp 11/01/16 1330 15     Temp 11/01/16 1258 (!) 96.9 F (36.1 C)     Temp Source 11/01/16 1258 Axillary     SpO2 11/01/16 1330 94 %     Weight --      Height --      Head Circumference --      Peak Flow --      Pain Score --      Pain Loc --      Pain Edu? --      Excl. in GC? --     Constitutional: Arousable. No acute distress.  Eyes: Conjunctivae are normal. PERRLA Head: Atraumatic. Nose: No congestion/rhinnorhea. Mouth/Throat: Mucous membranes are dry Neck:  Painless ROM, no vertebral tenderness to palpation Cardiovascular: Normal rate, regular rhythm. Grossly normal heart sounds.  Good peripheral circulation. Respiratory: Normal respiratory effort.  No retractions. Lungs CTAB. Gastrointestinal: Soft and nontender. No distention.  No CVA tenderness. Genitourinary: deferred Musculoskeletal: No lower extremity tenderness nor edema.  Warm and well perfused Neurologic:  . No gross focal neurologic deficits are appreciated. Appears to have equal strength in all extremities but patient is not able to cooperate with exam. Skin:  Skin is warm, dry and intact. No rash noted.   ____________________________________________   LABS (all labs ordered are listed, but only abnormal results are displayed)  Labs Reviewed  CBC - Abnormal; Notable for the following:       Result Value   RBC 5.57 (*)    Hemoglobin 11.4 (*)    MCV 65.7 (*)    MCH 20.4 (*)    MCHC 31.0 (*)    RDW 28.8 (*)    All other components within normal limits  COMPREHENSIVE METABOLIC PANEL - Abnormal; Notable for the following:    Chloride 99 (*)    Glucose, Bld 121 (*)    GFR calc non Af Amer 55 (*)    All other components within normal limits  URINALYSIS, COMPLETE (UACMP) WITH MICROSCOPIC - Abnormal; Notable for  the following:    Color, Urine YELLOW (*)    APPearance HAZY (*)    All other components within normal limits  APTT  PROTIME-INR  GLUCOSE, CAPILLARY  TROPONIN I  CBG MONITORING, ED   ____________________________________________  EKG  ED ECG REPORT I, Jene EveryKINNER, Brittnae Aschenbrenner, the attending physician, personally viewed and interpreted this ECG.  Date: 11/01/2016  Rate: 71 Rhythm: normal sinus rhythm QRS Axis: normal Intervals: normal ST/T Wave abnormalities: normal   ____________________________________________  RADIOLOGY  CT head unremarkable ____________________________________________   PROCEDURES  Procedure(s) performed: No    Critical Care performed: No ____________________________________________   INITIAL IMPRESSION /  ASSESSMENT AND PLAN / ED COURSE  Pertinent labs & imaging results that were available during my care of the patient were reviewed by me and considered in my medical decision making (see chart for details).  Patient presents after reported generalized tonic clonic seizure. No history of seizures per EMS and family. She appears postictal. CT head is reassuring, lab work is unremarkable. Given that this is a first onset I will admit for further evaluation     ____________________________________________   FINAL CLINICAL IMPRESSION(S) / ED DIAGNOSES  Final diagnoses:  Seizure (HCC)      NEW MEDICATIONS STARTED DURING THIS VISIT:  New Prescriptions   No medications on file     Note:  This document was prepared using Dragon voice recognition software and may include unintentional dictation errors.    Jene Every, MD 11/01/16 [phone]

## 2016-11-01 NOTE — ED Notes (Signed)
Lab called for add on troponin 

## 2016-11-01 NOTE — ED Notes (Signed)
Seizure pads placed on bedrails at this time.

## 2016-11-01 NOTE — H&P (Signed)
[facility] [place] [facility]   PATIENT NAME: Jessica Webster    MR#:  [id number]  DATE OF BIRTH:  Dec 18, 1945  DATE OF ADMISSION:  11/01/2016  PRIMARY CARE PHYSICIAN: Clovis PuVan Horn, Arturo MortonJill E, DO   REQUESTING/REFERRING PHYSICIAN: Dr. Cyril LoosenKinner  CHIEF COMPLAINT:   Chief Complaint  Patient presents with  . Seizures    HISTORY OF PRESENT ILLNESS:  Jessica Webster  is a 71 y.o. female with a known history of Asthma, GERD, Alzheimer's dementia, wheelchair bound on chronic Xanax presents to the emergency room from  house after she was noticed to have generalized tonic-clonic seizures. She is presently postictal and unable to contribute to history. At baseline does carry a conversation but has advanced dementia. CT scan of the head is normal. Blood work does not show any acute abnormalities. Urinalysis is normal  History obtained from reviewing old records, ER staff and family at bedside.  No history of seizures. Has not missed any medications. No new medications started in the last month.  PAST MEDICAL HISTORY:   Past Medical History:  Diagnosis Date  . Alzheimer disease   . Anxiety   . Asthma   . Hypertension     PAST SURGICAL HISTORY:   Past Surgical History:  Procedure Laterality Date  . APPENDECTOMY    . NASAL SINUS SURGERY      SOCIAL HISTORY:   Social History  Substance Use Topics  . Smoking status: Former Games developermoker  . Smokeless tobacco: Former NeurosurgeonUser  . Alcohol use No    FAMILY HISTORY:   Family History  Problem Relation Age of Onset  . Asthma Father   . Brain cancer Brother     DRUG ALLERGIES:   Allergies  Allergen Reactions  . Erythromycin Hives  . Sulfa Antibiotics Hives    REVIEW OF SYSTEMS:   Review of Systems  Unable to perform ROS: Dementia    MEDICATIONS AT HOME:   Prior to Admission medications   Medication Sig Start Date End Date Taking? Authorizing Provider  ALPRAZolam (XANAX) 0.25 MG tablet Take 0.25 mg by mouth 3  (three) times daily.   Yes [provider]  arformoterol (BROVANA) 15 MCG/2ML NEBU Take 2 mLs (15 mcg total) by nebulization 2 (two) times daily. 08/02/16  Yes Merwyn KatosSimonds, David B, MD  aspirin EC 81 MG tablet Take 81 mg by mouth daily.   Yes [provider]  atorvastatin (LIPITOR) 20 MG tablet Take 20 mg by mouth at bedtime.   Yes [provider]  cyclobenzaprine (FLEXERIL) 5 MG tablet Take 5 mg by mouth 2 (two) times daily.    Yes [provider]  donepezil (ARICEPT) 10 MG tablet Take 10 mg by mouth every morning.    Yes [provider]  ferrous sulfate 325 (65 FE) MG tablet Take 325 mg by mouth daily with breakfast.   Yes [provider]  fluticasone (FLONASE) 50 MCG/ACT nasal spray Place 2 sprays into both nostrils daily.   Yes [provider]  furosemide (LASIX) 20 MG tablet Take 20 mg by mouth.   Yes [provider]  hydrochlorothiazide (HYDRODIURIL) 12.5 MG tablet Take 25 mg by mouth daily.   Yes [provider]  loperamide (IMODIUM) 2 MG capsule Take 2 mg by mouth as needed for diarrhea or loose stools.   Yes [provider]  memantine (NAMENDA) 10 MG tablet Take 10 mg by mouth 2 (two) times daily.   Yes [provider]  montelukast (SINGULAIR)  10 MG tablet Take 10 mg by mouth every morning.    Yes [provider]  nortriptyline (PAMELOR) 10 MG capsule Take 20 mg by mouth at bedtime.    Yes [provider]  omeprazole (PRILOSEC) 20 MG capsule Take 20 mg by mouth daily.   Yes [provider]  predniSONE (DELTASONE) 5 MG tablet Take 1 tablet (5 mg total) by mouth daily with breakfast. 10/20/16  Yes Merwyn Katos, MD  risperiDONE (RISPERDAL) 0.5 MG tablet Take 0.5 mg by mouth 2 (two) times daily.   Yes [provider]  saccharomyces boulardii (FLORASTOR) 250 MG capsule Take 250 mg by mouth 2 (two) times daily.   Yes [provider]  sertraline (ZOLOFT)  100 MG tablet Take 100 mg by mouth at bedtime.    Yes [provider]  budesonide (PULMICORT) 0.5 MG/2ML nebulizer solution Take 2 mLs (0.5 mg total) by nebulization 2 (two) times daily. 08/02/16 08/02/17  Merwyn Katos, MD  ipratropium-albuterol (DUONEB) 0.5-2.5 (3) MG/3ML SOLN Inhale 3 mLs into the lungs every 4 (four) hours as needed. 09/20/16 09/15/17  Merwyn Katos, MD  polyethylene glycol (MIRALAX / Ethelene Hal) packet Take 17 g by mouth daily as needed.     [provider]     VITAL SIGNS:  Blood pressure 130/72, pulse 69, temperature (!) 96.9 F (36.1 C), temperature source Axillary, resp. rate 15, SpO2 93 %.  PHYSICAL EXAMINATION:  Physical Exam  GENERAL:  71 y.o.-year-old patient lying in the bed with no acute distress.  EYES: Pupils equal, round, reactive to light and accommodation. No scleral icterus. Extraocular muscles intact.  HEENT: Head atraumatic, normocephalic. Oropharynx and nasopharynx clear. No oropharyngeal erythema, moist oral mucosa.  NECK:  Supple, no jugular venous distention. No thyroid enlargement, no tenderness.  LUNGS: Normal breath sounds bilaterally, no wheezing, rales, rhonchi. No use of accessory muscles of respiration.  CARDIOVASCULAR: S1, S2 normal. No murmurs, rubs, or gallops.  ABDOMEN: Soft, nontender, nondistended. Bowel sounds present. No organomegaly or mass.  EXTREMITIES: No pedal edema, cyanosis, or clubbing. + 2 pedal & radial pulses b/l.   NEUROLOGIC: No flaccidity or rigidity noticed. Reflexes normal PSYCHIATRIC: The patient is drowsy but opens her eyes on calling her name SKIN: No obvious rash, lesion, or ulcer.   LABORATORY PANEL:   CBC  Recent Labs Lab 11/01/16 1236  WBC 7.5  HGB 11.4*  HCT 36.6  PLT 251   ------------------------------------------------------------------------------------------------------------------  Chemistries   Recent Labs Lab 11/01/16 1239  NA 138  K 3.7  CL 99*  CO2 24  GLUCOSE  121*  BUN 16  CREATININE 1.00  CALCIUM 9.5  AST 36  ALT 25  ALKPHOS 90  BILITOT 0.5   ------------------------------------------------------------------------------------------------------------------  Cardiac Enzymes  Recent Labs Lab 11/01/16 1239  TROPONINI <0.03   ------------------------------------------------------------------------------------------------------------------  RADIOLOGY:  Ct Head Wo Contrast  Result Date: 11/01/2016 CLINICAL DATA:  Seizure. EXAM: CT HEAD WITHOUT CONTRAST TECHNIQUE: Contiguous axial images were obtained from the base of the skull through the vertex without intravenous contrast. COMPARISON:  CT scan of May 05, 2016.  MRI of May 18, 2016. FINDINGS: Brain: Mild diffuse cortical atrophy is noted. Mild chronic ischemic white matter disease is noted. No mass effect or midline shift is noted. Ventricular size is within normal limits. There is no evidence of mass lesion, hemorrhage or acute infarction. Vascular: No hyperdense vessel or unexpected calcification. Skull: Normal. Negative for fracture or focal lesion. Sinuses/Orbits: Mild left maxillary sinusitis is noted. Status  post bilateral maxillary antrostomies. Other: None. IMPRESSION: Mild diffuse cortical atrophy. Mild chronic ischemic white matter disease. No acute intracranial abnormality seen. Electronically Signed   By: Lupita Raider, M.D.   On: 11/01/2016 13:02     IMPRESSION AND PLAN:   * New onset seizures - CT scan of the head shows nothing acute. No signs of infection. Normal electrolytes and WBC. She takes Xanax chronically and has not missed any doses. Etiology unclear Observation admission. Seizure precautions. Check MRI of the brain Consult neurology for new onset seizures. Ativan when necessary for further seizures. We'll start Keppra if any further seizures Check TSH and magnesium.  * Dementia. Continue home medications.  * Asthma. Continue patient's nebulizers and  prednisone.  * DVT prophylaxis with Lovenox  All the records are reviewed and case discussed with ED provider. Management plans discussed with the patient, family and they are in agreement.  CODE STATUS: DNR  TOTAL TIME TAKING CARE OF THIS PATIENT: 40 minutes.   Milagros Loll R M.D on 11/01/2016 at 3:32 PM  Between 7am to 6pm - Pager - [phone]  After 6pm go to [email or web address] - password [email or web address] [facility]  [facility] [place] [facility]  Office  [phone]  CC: Primary care physician; Clovis Pu, Arturo Morton, DO  Note: This dictation was prepared with Dragon dictation along with smaller phrase technology. Any transcriptional errors that result from this process are unintentional.

## 2016-11-01 NOTE — ED Notes (Signed)
Pt waking up and more alert at this time, family at bedside

## 2016-11-02 ENCOUNTER — Observation Stay: Payer: Medicare Other

## 2016-11-02 DIAGNOSIS — G40909 Epilepsy, unspecified, not intractable, without status epilepticus: Secondary | ICD-10-CM | POA: Diagnosis not present

## 2016-11-02 DIAGNOSIS — R569 Unspecified convulsions: Secondary | ICD-10-CM | POA: Diagnosis not present

## 2016-11-02 MED ORDER — MUPIROCIN 2 % EX OINT
1.0000 "application " | TOPICAL_OINTMENT | Freq: Two times a day (BID) | CUTANEOUS | Status: DC
  Administered 2016-11-02 – 2016-11-04 (×6): 1 via NASAL
  Filled 2016-11-02: qty 22

## 2016-11-02 MED ORDER — ALPRAZOLAM 0.5 MG PO TABS
0.2500 mg | ORAL_TABLET | Freq: Three times a day (TID) | ORAL | Status: DC
  Administered 2016-11-02 – 2016-11-04 (×4): 0.25 mg via ORAL
  Filled 2016-11-02 (×4): qty 1

## 2016-11-02 MED ORDER — ALPRAZOLAM 0.5 MG PO TABS
0.2500 mg | ORAL_TABLET | Freq: Once | ORAL | Status: AC
  Administered 2016-11-02: 0.25 mg via ORAL
  Filled 2016-11-02: qty 1

## 2016-11-02 MED ORDER — CHLORHEXIDINE GLUCONATE CLOTH 2 % EX PADS
6.0000 | MEDICATED_PAD | Freq: Every day | CUTANEOUS | Status: DC
  Administered 2016-11-02 – 2016-11-04 (×3): 6 via TOPICAL

## 2016-11-02 MED ORDER — NORTRIPTYLINE HCL 10 MG PO CAPS
20.0000 mg | ORAL_CAPSULE | Freq: Every day | ORAL | Status: DC
  Administered 2016-11-02 – 2016-11-03 (×2): 20 mg via ORAL
  Filled 2016-11-02 (×3): qty 2

## 2016-11-02 MED ORDER — VALPROATE SODIUM 500 MG/5ML IV SOLN
750.0000 mg | Freq: Once | INTRAVENOUS | Status: AC
  Administered 2016-11-02: 750 mg via INTRAVENOUS
  Filled 2016-11-02: qty 7.5

## 2016-11-02 MED ORDER — DIVALPROEX SODIUM 500 MG PO DR TAB
500.0000 mg | DELAYED_RELEASE_TABLET | Freq: Two times a day (BID) | ORAL | Status: DC
  Administered 2016-11-02: 500 mg via ORAL
  Filled 2016-11-02 (×2): qty 1

## 2016-11-02 NOTE — Progress Notes (Signed)
Patient ID: Jessica Webster, female   DOB: 1945/05/30, 71 y.o.   MRN: [id number]  [facility] PROGRESS NOTE  Magdaline Zollars GEX:[id number] DOB: 1945-04-30 DOA: 11/01/2016 PCP: Clovis Pu, Arturo Morton, DO  HPI/Subjective: Patient has dementia and is difficult to understand. She was able to recognize her husband and her daughter. She was able to follow some simple commands. She complained to family about having back pain but I was unable to elicit this  Objective: Vitals:   11/02/16 0512 11/02/16 0857  BP: 132/61 (!) 142/85  Pulse: 71 85  Resp: 18   Temp: 97.8 F (36.6 C) 97.8 F (36.6 C)    Filed Weights   11/01/16 1653 11/02/16 0500  Weight: 70 kg (154 lb 4.8 oz) 72.9 kg (160 lb 11.2 oz)    ROS: Review of Systems  Unable to perform ROS: Dementia  Musculoskeletal: Positive for back pain.   Exam: Physical Exam  HENT:  Nose: No mucosal edema.  Mouth/Throat: No oropharyngeal exudate or posterior oropharyngeal edema.  Eyes: Conjunctivae and lids are normal. Pupils are equal, round, and reactive to light.  Neck: No JVD present. Carotid bruit is not present. No edema present. No thyroid mass and no thyromegaly present.  Cardiovascular: S1 normal and S2 normal.  Exam reveals no gallop.   No murmur heard. Pulses:      Dorsalis pedis pulses are 2+ on the right side, and 2+ on the left side.  Respiratory: No respiratory distress. She has no wheezes. She has no rhonchi. She has no rales.  GI: Soft. Bowel sounds are normal. There is no tenderness.  Musculoskeletal:       Right ankle: She exhibits swelling.       Left ankle: She exhibits swelling.  Lymphadenopathy:    She has no cervical adenopathy.  Neurological: She is alert.  Patient moves all of her extremities on her own and to command  Skin: Skin is warm. No rash noted. Nails show no clubbing.  Psychiatric:  Patient able to communicate but difficult to understand at times      Data Reviewed: Basic Metabolic Panel:  Recent  Labs Lab 11/01/16 1239 11/01/16 1400  NA 138  --   K 3.7  --   CL 99*  --   CO2 24  --   GLUCOSE 121*  --   BUN 16  --   CREATININE 1.00  --   CALCIUM 9.5  --   MG  --  2.3   Liver Function Tests:  Recent Labs Lab 11/01/16 1239  AST 36  ALT 25  ALKPHOS 90  BILITOT 0.5  PROT 7.5  ALBUMIN 3.8   CBC:  Recent Labs Lab 11/01/16 1236  WBC 7.5  HGB 11.4*  HCT 36.6  MCV 65.7*  PLT 251   Cardiac Enzymes:  Recent Labs Lab 11/01/16 1239  TROPONINI <0.03    CBG:  Recent Labs Lab 11/01/16 1309  GLUCAP 99    Recent Results (from the past 240 hour(s))  MRSA PCR Screening     Status: Abnormal   Collection Time: 11/01/16  9:32 PM  Result Value Ref Range Status   MRSA by PCR POSITIVE (A) NEGATIVE Final    Comment:        The GeneXpert MRSA Assay (FDA approved for NASAL specimens only), is one component of a comprehensive MRSA colonization surveillance program. It is not intended to diagnose MRSA infection nor to guide or monitor treatment for MRSA infections. RESULT CALLED TO, READ BACK  BY AND VERIFIED WITH: CYNTHIA BURGESS @ 2349 ON 11/01/2016 BY CAF      Studies: Ct Head Wo Contrast  Result Date: 11/01/2016 CLINICAL DATA:  Seizure. EXAM: CT HEAD WITHOUT CONTRAST TECHNIQUE: Contiguous axial images were obtained from the base of the skull through the vertex without intravenous contrast. COMPARISON:  CT scan of May 05, 2016.  MRI of May 18, 2016. FINDINGS: Brain: Mild diffuse cortical atrophy is noted. Mild chronic ischemic white matter disease is noted. No mass effect or midline shift is noted. Ventricular size is within normal limits. There is no evidence of mass lesion, hemorrhage or acute infarction. Vascular: No hyperdense vessel or unexpected calcification. Skull: Normal. Negative for fracture or focal lesion. Sinuses/Orbits: Mild left maxillary sinusitis is noted. Status post bilateral maxillary antrostomies. Other: None. IMPRESSION: Mild diffuse  cortical atrophy. Mild chronic ischemic white matter disease. No acute intracranial abnormality seen. Electronically Signed   By: Lupita Raider, M.D.   On: 11/01/2016 13:02   Mr Laqueta Jean NF Contrast  Result Date: 11/01/2016 CLINICAL DATA:  Witnessed seizure at care facility. History of Alzheimer's disease, hypertension. EXAM: MRI HEAD WITHOUT AND WITH CONTRAST TECHNIQUE: Multiplanar, multiecho pulse sequences of the brain and surrounding structures were obtained without and with intravenous contrast. CONTRAST:  14mL MULTIHANCE GADOBENATE DIMEGLUMINE 529 MG/ML IV SOLN COMPARISON:  CT HEAD November 01, 2016 at 1247 hours and MRI of the head May 18, 2016 FINDINGS: INTRACRANIAL CONTENTS: No reduced diffusion to suggest acute ischemia or hypercellular tumor. No susceptibility artifact to suggest hemorrhage. Moderate to severe ventriculomegaly, disproportionate mesial temporal lobe volume loss with mild ex vacuo dilatation subjacent ventricle. No hydrocephalus. Patchy supratentorial white matter FLAIR T2 hyperintensities are similar. No midline shift, mass effect or masses. Normal abnormal parenchymal or extra-axial enhancement. No abnormal extra-axial fluid collections. VASCULAR: Normal major intracranial vascular flow voids present at skull base. SKULL AND UPPER CERVICAL SPINE: No abnormal sellar expansion. No suspicious calvarial bone marrow signal. Craniocervical junction maintained. SINUSES/ORBITS: Moderate paranasal sinus mucosal thickening, status post FESS . Small mastoid effusions. The included ocular globes and orbital contents are non-suspicious. OTHER: None. IMPRESSION: 1. No acute intracranial process. 2. Stable parenchymal brain volume loss including disproportionate mesial temporal lobe atrophy seen with neurodegenerative disease. 3. Stable moderate chronic small vessel ischemic disease. Electronically Signed   By: Awilda Metro M.D.   On: 11/01/2016 19:36    Scheduled Meds: . ALPRAZolam  0.25  mg Oral TID  . arformoterol  15 mcg Nebulization BID  . aspirin EC  81 mg Oral Daily  . atorvastatin  20 mg Oral QHS  . budesonide  0.5 mg Nebulization BID  . Chlorhexidine Gluconate Cloth  6 each Topical Q0600  . cyclobenzaprine  5 mg Oral BID  . divalproex  500 mg Oral Q12H  . donepezil  10 mg Oral q morning - 10a  . enoxaparin (LOVENOX) injection  40 mg Subcutaneous Q24H  . ferrous sulfate  325 mg Oral Q breakfast  . fluticasone  2 spray Each Nare Daily  . furosemide  20 mg Oral Daily  . hydrochlorothiazide  25 mg Oral Daily  . memantine  10 mg Oral BID  . montelukast  10 mg Oral q morning - 10a  . mupirocin ointment  1 application Nasal BID  . nortriptyline  20 mg Oral QHS  . predniSONE  5 mg Oral Q breakfast  . risperiDONE  0.5 mg Oral BID  . saccharomyces boulardii  250 mg Oral BID  .  sertraline  100 mg Oral QHS   Continuous Infusions: . valproate sodium      Assessment/Plan:  1. Seizure disorder. Case discussed with Dr. Thad Rangereynolds neurology. IV Depakote load and then oral Depakote. EEG ordered. MRI showing that this was not an acute stroke. 2. Dementia with behavioral disturbance and anxiety. Restarted Xanax. Continue Zoloft) all nortriptyline. Patient also on Namenda and Aricept. 3. Asthma. Respiratory status stable continue nebulizer treatments. Unclear why she is on prednisone will have to clarify this with the family tomorrow 4. Hyperlipidemia unspecified on atorvastatin 5. Essential hypertension on hydrochlorothiazide and also on Lasix which on hesitant on   Code Status:     Code Status Orders        Start     Ordered   11/01/16 1529  Do not attempt resuscitation (DNR)  Continuous    Question Answer Comment  In the event of cardiac or respiratory ARREST Do not call a "code blue"   In the event of cardiac or respiratory ARREST Do not perform Intubation, CPR, defibrillation or ACLS   In the event of cardiac or respiratory ARREST Use medication by any route,  position, wound care, and other measures to relive pain and suffering. May use oxygen, suction and manual treatment of airway obstruction as needed for comfort.      11/01/16 1529    Code Status History    Date Active Date Inactive Code Status Order ID Comments User Context   This patient has a current code status but no historical code status.    Advance Directive Documentation     Most Recent Value  Type of Advance Directive  Out of facility DNR (pink MOST or yellow form)  Pre-existing out of facility DNR order (yellow form or pink MOST form)  Yellow form placed in chart (order not valid for inpatient use)  "MOST" Form in Place?  -     Family Communication: Husband and daughter at the bedside Disposition Plan: Potentially back to facility tomorrow  Consultants:  Neurology  Time spent: 30 minutes  Alford HighlandWIETING, Oakleigh Hesketh  [employer]

## 2016-11-02 NOTE — Care Management (Signed)
Received referral for Ms. Urich lives at [facility]. Clinical Social Worker, Truddie HiddenSarah McNutly, updated. Observation notice signed per husband, Audie ClearRobert Jayln Madeira S Kellyanne Ellwanger RN MSN CCM Care Management [phone]

## 2016-11-02 NOTE — Care Management Obs Status (Signed)
MEDICARE OBSERVATION STATUS NOTIFICATION   Patient Details  Name: Jessica Webster MRN: [id number] Date of Birth: Dec 16, 1945   Medicare Observation Status Notification Given:  Yes    Gwenette GreetBrenda S Falen Lehrmann, RN 11/02/2016, 10:41 AM

## 2016-11-02 NOTE — Progress Notes (Signed)
Physical Therapy Evaluation Patient Details Name: Jessica Webster MRN: [id number] DOB: 21-Sep-1945 Today's Date: 11/02/2016   History of Present Illness  71 y/o admitted on 11/01/16 due to a seizure. Pt currently is a resident at [facility], which is where a tonic-clonic seizure was witnessed by staff. PMH includes Alzheimer's disease, asthma, GERD, and HTN. Medical chart states pt is w/c bound at this facility.     Clinical Impression  Pt is a pleasant 71 year old admitted for a seizure. At baseline, pt is w/c bound at [facility] long-term care facility. This session limited due to pt's inability/resistance to follow commands consistency. Pt performs bed mobility and transfers with +2 min assist for physical assistance and safety. Sit to/from stand performed 3x. Pt able to perform transfer, but unable to come to fully upright position secondary to impaired cognition status. Ambulation deferred due to pt's weakness and inability/resistance to follow commands to progress to transfer from EOB to recliner. Pt demonstrates deficits in functional mobility and cognition. Would benefit from skilled PT to address above deficits and promote optimal return to PLOF. PT recommends dc to [facility] long-term care facility. This entire session was guided, instructed, and directly supervised by Elizabeth PalauStephanie Ray, DPT.     Follow Up Recommendations No PT follow up;Other (comment) (DC to [facility] - long-term care)    Equipment Recommendations  None recommended by PT    Recommendations for Other Services       Precautions / Restrictions Precautions Precautions: Fall;Other (comment) (seizure precautions) Restrictions Weight Bearing Restrictions: No      Mobility  Bed Mobility Overal bed mobility: Needs Assistance Bed Mobility: Supine to Sit;Sit to Supine     Supine to sit: Min assist;+2 for physical assistance;+2 for safety/equipment Sit to supine: Min assist;+2 for physical assistance;+2 for  safety/equipment   General bed mobility comments: +2 min assist for sit to/from supine. Verbal cueing for sequencing and hand placement (hold hands with PT's). Pt hesitant to perform movements, but able to do so with min assist provided.  Transfers Overall transfer level: Needs assistance Equipment used: 2 person hand held assist Transfers: Sit to/from Stand Sit to Stand: Min assist;+2 physical assistance;+2 safety/equipment         General transfer comment: Sit to/from stand with +2 min assist for safety. Verbal cueing to "stand up on your feet" with pt holding PT's hands. Pt able to stand up partially, but resistant to follow commands and stand fully upright.   Ambulation/Gait             General Gait Details: Deferred due to pt's weakness and inability to follow commands consistently.  Stairs            Wheelchair Mobility    Modified Rankin (Stroke Patients Only)       Balance Overall balance assessment: Needs assistance     Sitting balance - Comments: Able to sit EOB with no UE support and feet on floor with no LOB or unsteadiness noted.       Standing balance comment: Poor - stood with +2 min assist. Balance poor due to pt's resistance to activity.                             Pertinent Vitals/Pain Pain Assessment: No/denies pain    Home Living Family/patient expects to be discharged to:: Other (Comment)  Additional Comments: [facility] - long term facility    Prior Function Level of Independence: Needs assistance   Gait / Transfers Assistance Needed: Pt is currently w/c bound at long-term care facility.           Hand Dominance        Extremity/Trunk Assessment   Upper Extremity Assessment Upper Extremity Assessment: Generalized weakness (no formal MMT performed - grossly 4/5 B )    Lower Extremity Assessment Lower Extremity Assessment: Generalized weakness (grossly 4/5 B - no formal MMT  performed)       Communication   Communication: Receptive difficulties;Expressive difficulties (dementia)  Cognition Arousal/Alertness: Awake/alert Behavior During Therapy: Anxious;Agitated (Pt tended to fidget with clothing and telemetry leads) Overall Cognitive Status: History of cognitive impairments - at baseline                                        General Comments      Exercises Other Exercises Other Exercises: Sit to/from stand performed x2 with +2 min assist for safety. Pt resistant to stand fully, and unable to follow commands to progress to transfer to recliner from standing position.    Assessment/Plan    PT Assessment Patient needs continued PT services  PT Problem List Decreased strength;Decreased activity tolerance;Decreased balance;Decreased mobility;Decreased cognition       PT Treatment Interventions Gait training;Therapeutic activities;Therapeutic exercise;Functional mobility training;Balance training;Patient/family education    PT Goals (Current goals can be found in the Care Plan section)  Acute Rehab PT Goals Patient Stated Goal: to get better PT Goal Formulation: With patient/family Time For Goal Achievement: 11/16/16 Potential to Achieve Goals: Fair    Frequency Other (Comment) (3 day trial)   Barriers to discharge        Co-evaluation               AM-PAC PT "6 Clicks" Daily Activity  Outcome Measure Difficulty turning over in bed (including adjusting bedclothes, sheets and blankets)?: A Lot Difficulty moving from lying on back to sitting on the side of the bed? : Total Difficulty sitting down on and standing up from a chair with arms (e.g., wheelchair, bedside commode, etc,.)?: Total Help needed moving to and from a bed to chair (including a wheelchair)?: Total Help needed walking in hospital room?: Total Help needed climbing 3-5 steps with a railing? : Total 6 Click Score: 7    End of Session Equipment Utilized  During Treatment: Gait belt Activity Tolerance: Other (comment) (Limited due to inability/resistance to follow commands.) Patient left: in bed;with call bell/phone within reach;with bed alarm set;with family/visitor present Nurse Communication: Mobility status PT Visit Diagnosis: Other abnormalities of gait and mobility (R26.89);Muscle weakness (generalized) (M62.81);Other (comment) (dementia)    Time: 1610-9604 PT Time Calculation (min) (ACUTE ONLY): 19 min   Charges:         PT G Codes:        Mabeline Caras, PT, SPT  Jessica Webster 11/02/2016, 11:04 AM

## 2016-11-02 NOTE — Consult Note (Addendum)
Reason for Consult:Seizure Referring Physician: Wieting  CC: Seizure  HPI: Jessica Webster is an 71 y.o. female with a history of Alzheimer's dementia who presentsafter a witnessed seizure. Patient presents from her nursing facility.  Reportedly she had a generalized tonic-clonic seizure with bladder incontinence.  No tongue biting noted.  She has no history of seizures in the past. No fevers reported. No vomiting reported. Patient postictal state on presentation.   Past Medical History:  Diagnosis Date  . Alzheimer disease   . Anxiety   . Asthma   . Hypertension     Past Surgical History:  Procedure Laterality Date  . APPENDECTOMY    . NASAL SINUS SURGERY      Family History  Problem Relation Age of Onset  . Asthma Father   . Brain cancer Brother     Social History:  reports that she has quit smoking. She has quit using smokeless tobacco. She reports that she does not drink alcohol or use drugs.  Allergies  Allergen Reactions  . Erythromycin Hives  . Sulfa Antibiotics Hives    Medications:  I have reviewed the patient's current medications. Prior to Admission:  Prescriptions Prior to Admission  Medication Sig Dispense Refill Last Dose  . ALPRAZolam (XANAX) 0.25 MG tablet Take 0.25 mg by mouth 3 (three) times daily.   11/01/2016 at 0800  . arformoterol (BROVANA) 15 MCG/2ML NEBU Take 2 mLs (15 mcg total) by nebulization 2 (two) times daily. 120 mL 6 11/01/2016 at 0800  . aspirin EC 81 MG tablet Take 81 mg by mouth daily.   11/01/2016 at 0800  . atorvastatin (LIPITOR) 20 MG tablet Take 20 mg by mouth at bedtime.   11/01/2016 at 0800  . cyclobenzaprine (FLEXERIL) 5 MG tablet Take 5 mg by mouth 2 (two) times daily.    11/01/2016 at 0800  . donepezil (ARICEPT) 10 MG tablet Take 10 mg by mouth every morning.    11/01/2016 at 0800  . ferrous sulfate 325 (65 FE) MG tablet Take 325 mg by mouth daily with breakfast.   11/01/2016 at 0800  . fluticasone (FLONASE) 50 MCG/ACT nasal spray  Place 2 sprays into both nostrils daily.   11/01/2016 at 0800  . furosemide (LASIX) 20 MG tablet Take 20 mg by mouth.   11/01/2016 at 0800  . hydrochlorothiazide (HYDRODIURIL) 12.5 MG tablet Take 25 mg by mouth daily.   11/01/2016 at 0800  . loperamide (IMODIUM) 2 MG capsule Take 2 mg by mouth as needed for diarrhea or loose stools.   prn at prn  . memantine (NAMENDA) 10 MG tablet Take 10 mg by mouth 2 (two) times daily.   11/01/2016 at 0800  . montelukast (SINGULAIR) 10 MG tablet Take 10 mg by mouth every morning.    11/01/2016 at 0800  . nortriptyline (PAMELOR) 10 MG capsule Take 20 mg by mouth at bedtime.    11/01/2016 at 0800  . omeprazole (PRILOSEC) 20 MG capsule Take 20 mg by mouth daily.   11/01/2016 at 0800  . predniSONE (DELTASONE) 5 MG tablet Take 1 tablet (5 mg total) by mouth daily with breakfast. 30 tablet 5 11/01/2016 at 0800  . risperiDONE (RISPERDAL) 0.5 MG tablet Take 0.5 mg by mouth 2 (two) times daily.   11/01/2016 at 0800  . saccharomyces boulardii (FLORASTOR) 250 MG capsule Take 250 mg by mouth 2 (two) times daily.   11/01/2016 at 0800  . sertraline (ZOLOFT) 100 MG tablet Take 100 mg by mouth at bedtime.  11/01/2016 at 0800  . budesonide (PULMICORT) 0.5 MG/2ML nebulizer solution Take 2 mLs (0.5 mg total) by nebulization 2 (two) times daily. 120 mL 10 prn at prn  . ipratropium-albuterol (DUONEB) 0.5-2.5 (3) MG/3ML SOLN Inhale 3 mLs into the lungs every 4 (four) hours as needed. 540 mL 11 prn at prn  . polyethylene glycol (MIRALAX / GLYCOLAX) packet Take 17 g by mouth daily as needed.    prn at prn   Scheduled: . ALPRAZolam  0.25 mg Oral TID  . arformoterol  15 mcg Nebulization BID  . aspirin EC  81 mg Oral Daily  . atorvastatin  20 mg Oral QHS  . budesonide  0.5 mg Nebulization BID  . Chlorhexidine Gluconate Cloth  6 each Topical Q0600  . cyclobenzaprine  5 mg Oral BID  . divalproex  500 mg Oral Q12H  . donepezil  10 mg Oral q morning - 10a  . enoxaparin (LOVENOX) injection  40  mg Subcutaneous Q24H  . ferrous sulfate  325 mg Oral Q breakfast  . fluticasone  2 spray Each Nare Daily  . furosemide  20 mg Oral Daily  . hydrochlorothiazide  25 mg Oral Daily  . memantine  10 mg Oral BID  . montelukast  10 mg Oral q morning - 10a  . mupirocin ointment  1 application Nasal BID  . nortriptyline  20 mg Oral QHS  . predniSONE  5 mg Oral Q breakfast  . risperiDONE  0.5 mg Oral BID  . saccharomyces boulardii  250 mg Oral BID  . sertraline  100 mg Oral QHS    ROS: Unable to obtain secondary to dementia  Physical Examination: Blood pressure (!) 142/85, pulse 85, temperature 97.8 F (36.6 C), temperature source Axillary, resp. rate 18, height 5\' 7"  (1.702 m), weight 72.9 kg (160 lb 11.2 oz), SpO2 100 %.  HEENT-  Normocephalic, no lesions, without obvious abnormality.  Normal external eye and conjunctiva.  Normal TM's bilaterally.  Normal auditory canals and external ears. Normal external nose, mucus membranes and septum.  Normal pharynx. Cardiovascular- S1, S2 normal, pulses palpable throughout   Lungs- chest clear, no wheezing, rales, normal symmetric air entry Abdomen- soft, non-tender; bowel sounds normal; no masses,  no organomegaly Extremities- no edema Lymph-no adenopathy palpable Musculoskeletal-no joint tenderness, deformity or swelling Skin-warm and dry, no hyperpigmentation, vitiligo, or suspicious lesions  Neurological Examination   Mental Status: Alert.  Aphasic. Able to follow some simple step commands. Cranial Nerves: II: Discs flat bilaterally; Visual fields grossly normal, pupils equal, round, reactive to light and accommodation III,IV, VI: ptosis not present, extra-ocular motions intact bilaterally V,VII: smile symmetric, facial light touch sensation normal bilaterally VIII: hearing normal bilaterally IX,X: gag reflex present XI: bilateral shoulder shrug XII: midline tongue extension Motor: Patient able to move all extremities against gravity  with no focal weakness noted Sensory: Responds to noxious stimuli throughout Deep Tendon Reflexes: 2+ and symmetric with absent AJ's bilaterally Plantars: Right: mute   Left: mute Cerebellar: Normal finger-to-nose bilaterally Gait: patient nonambulatory   Laboratory Studies:   Basic Metabolic Panel:  Recent Labs Lab 11/01/16 1239 11/01/16 1400  NA 138  --   K 3.7  --   CL 99*  --   CO2 24  --   GLUCOSE 121*  --   BUN 16  --   CREATININE 1.00  --   CALCIUM 9.5  --   MG  --  2.3    Liver Function Tests:  Recent Labs Lab  11/01/16 1239  AST 36  ALT 25  ALKPHOS 90  BILITOT 0.5  PROT 7.5  ALBUMIN 3.8   No results for input(s): LIPASE, AMYLASE in the last 168 hours. No results for input(s): AMMONIA in the last 168 hours.  CBC:  Recent Labs Lab 11/01/16 1236  WBC 7.5  HGB 11.4*  HCT 36.6  MCV 65.7*  PLT 251    Cardiac Enzymes:  Recent Labs Lab 11/01/16 1239  TROPONINI <0.03    BNP: Invalid input(s): POCBNP  CBG:  Recent Labs Lab 11/01/16 1309  GLUCAP 99    Microbiology: Results for orders placed or performed during the hospital encounter of 11/01/16  MRSA PCR Screening     Status: Abnormal   Collection Time: 11/01/16  9:32 PM  Result Value Ref Range Status   MRSA by PCR POSITIVE (A) NEGATIVE Final    Comment:        The GeneXpert MRSA Assay (FDA approved for NASAL specimens only), is one component of a comprehensive MRSA colonization surveillance program. It is not intended to diagnose MRSA infection nor to guide or monitor treatment for MRSA infections. RESULT CALLED TO, READ BACK BY AND VERIFIED WITH: CYNTHIA BURGESS @ 2349 ON 11/01/2016 BY CAF     Coagulation Studies:  Recent Labs  11/01/16 1239  LABPROT 13.8  INR 1.06    Urinalysis:  Recent Labs Lab 11/01/16 1254  COLORURINE YELLOW*  LABSPEC 1.014  PHURINE 6.0  GLUCOSEU NEGATIVE  HGBUR NEGATIVE  BILIRUBINUR NEGATIVE  KETONESUR NEGATIVE  PROTEINUR NEGATIVE   NITRITE NEGATIVE  LEUKOCYTESUR NEGATIVE    Lipid Panel:  No results found for: CHOL, TRIG, HDL, CHOLHDL, VLDL, LDLCALC  HgbA1C: No results found for: HGBA1C  Urine Drug Screen:     Component Value Date/Time   LABOPIA NONE DETECTED 05/05/2016 1749   COCAINSCRNUR NONE DETECTED 05/05/2016 1749   LABBENZ NONE DETECTED 05/05/2016 1749   AMPHETMU NONE DETECTED 05/05/2016 1749   THCU NONE DETECTED 05/05/2016 1749   LABBARB NONE DETECTED 05/05/2016 1749    Alcohol Level: No results for input(s): ETH in the last 168 hours.  Other results: EKG: sinus rhythm at 71 bpm.  Imaging: Ct Head Wo Contrast  Result Date: 11/01/2016 CLINICAL DATA:  Seizure. EXAM: CT HEAD WITHOUT CONTRAST TECHNIQUE: Contiguous axial images were obtained from the base of the skull through the vertex without intravenous contrast. COMPARISON:  CT scan of May 05, 2016.  MRI of May 18, 2016. FINDINGS: Brain: Mild diffuse cortical atrophy is noted. Mild chronic ischemic white matter disease is noted. No mass effect or midline shift is noted. Ventricular size is within normal limits. There is no evidence of mass lesion, hemorrhage or acute infarction. Vascular: No hyperdense vessel or unexpected calcification. Skull: Normal. Negative for fracture or focal lesion. Sinuses/Orbits: Mild left maxillary sinusitis is noted. Status post bilateral maxillary antrostomies. Other: None. IMPRESSION: Mild diffuse cortical atrophy. Mild chronic ischemic white matter disease. No acute intracranial abnormality seen. Electronically Signed   By: Lupita RaiderJames  Green Jr, M.D.   On: 11/01/2016 13:02   Mr Laqueta JeanBrain W ZOWo Contrast  Result Date: 11/01/2016 CLINICAL DATA:  Witnessed seizure at care facility. History of Alzheimer's disease, hypertension. EXAM: MRI HEAD WITHOUT AND WITH CONTRAST TECHNIQUE: Multiplanar, multiecho pulse sequences of the brain and surrounding structures were obtained without and with intravenous contrast. CONTRAST:  14mL  MULTIHANCE GADOBENATE DIMEGLUMINE 529 MG/ML IV SOLN COMPARISON:  CT HEAD November 01, 2016 at 1247 hours and MRI of the head May 18, 2016 FINDINGS: INTRACRANIAL CONTENTS: No reduced diffusion to suggest acute ischemia or hypercellular tumor. No susceptibility artifact to suggest hemorrhage. Moderate to severe ventriculomegaly, disproportionate mesial temporal lobe volume loss with mild ex vacuo dilatation subjacent ventricle. No hydrocephalus. Patchy supratentorial white matter FLAIR T2 hyperintensities are similar. No midline shift, mass effect or masses. Normal abnormal parenchymal or extra-axial enhancement. No abnormal extra-axial fluid collections. VASCULAR: Normal major intracranial vascular flow voids present at skull base. SKULL AND UPPER CERVICAL SPINE: No abnormal sellar expansion. No suspicious calvarial bone marrow signal. Craniocervical junction maintained. SINUSES/ORBITS: Moderate paranasal sinus mucosal thickening, status post FESS . Small mastoid effusions. The included ocular globes and orbital contents are non-suspicious. OTHER: None. IMPRESSION: 1. No acute intracranial process. 2. Stable parenchymal brain volume loss including disproportionate mesial temporal lobe atrophy seen with neurodegenerative disease. 3. Stable moderate chronic small vessel ischemic disease. Electronically Signed   By: Awilda Metro M.D.   On: 11/01/2016 19:36     Assessment/Plan: 71 year old female with a history of advanced dementia who presents with new onset seizure.  MRI of the brain reviewed and shows atrophy but no acute changes.  EEG unremarkable.  Lab work unremarkable.  Patient on Xanax but has had no recent change in administration.  Seizure likely a consequence of dementia.  Would initiate anticonvulsant therapy.  Recommendations: 1.  Depacon 750mg  IV load with maintenance of Depakote 500mg  BID.  Depakote may provide some behavioral benefits as well. 2.  Seizure precautions 3.  Depakote level in  AM   Thana Farr, MD Neurology [phone] 11/02/2016, 4:33 PM

## 2016-11-03 DIAGNOSIS — G40909 Epilepsy, unspecified, not intractable, without status epilepticus: Secondary | ICD-10-CM | POA: Diagnosis not present

## 2016-11-03 DIAGNOSIS — R569 Unspecified convulsions: Secondary | ICD-10-CM | POA: Diagnosis not present

## 2016-11-03 LAB — BASIC METABOLIC PANEL
ANION GAP: 11 (ref 5–15)
BUN: 16 mg/dL (ref 6–20)
CHLORIDE: 100 mmol/L — AB (ref 101–111)
CO2: 28 mmol/L (ref 22–32)
Calcium: 9 mg/dL (ref 8.9–10.3)
Creatinine, Ser: 1 mg/dL (ref 0.44–1.00)
GFR calc Af Amer: >60 mL/min (ref >60)
GFR, EST NON AFRICAN AMERICAN: 55 mL/min — AB (ref >60)
GLUCOSE: 111 mg/dL — AB (ref 65–99)
POTASSIUM: 3.3 mmol/L — AB (ref 3.5–5.1)
Sodium: 139 mmol/L (ref 135–145)

## 2016-11-03 LAB — VALPROIC ACID LEVEL: VALPROIC ACID LVL: 56 ug/mL (ref 50.0–100.0)

## 2016-11-03 MED ORDER — DIVALPROEX SODIUM 250 MG PO DR TAB
250.0000 mg | DELAYED_RELEASE_TABLET | Freq: Two times a day (BID) | ORAL | Status: DC
  Administered 2016-11-03 – 2016-11-04 (×3): 250 mg via ORAL
  Filled 2016-11-03 (×5): qty 1

## 2016-11-03 MED ORDER — POTASSIUM CHLORIDE CRYS ER 20 MEQ PO TBCR
40.0000 meq | EXTENDED_RELEASE_TABLET | Freq: Once | ORAL | Status: AC
  Administered 2016-11-03: 11:00:00 40 meq via ORAL
  Filled 2016-11-03: qty 2

## 2016-11-03 MED ORDER — DIVALPROEX SODIUM 250 MG PO DR TAB: 250.0000 mg | DELAYED_RELEASE_TABLET | Freq: Two times a day (BID) | ORAL | Status: DC

## 2016-11-03 NOTE — Progress Notes (Signed)
Patient ID: Jessica Webster, female   DOB: 01-21-1946, 71 y.o.   MRN: [id number]   [facility] PROGRESS NOTE  Jessica Webster DOB: [birth year] DOA: 11/01/2016 PCP: Jessica Webster  HPI/Subjective: Patient was seen late morning and she was very lethargic. She did not take a nap yesterday and had a little agitation yesterday evening. Neurology recommended watching again overnight and decreasing the dose of Depakote. When I saw her she was able to answer a few more questions  Objective: Vitals:   11/03/16 0622 11/03/16 1218  BP: (!) 104/59 122/68  Pulse: 70 91  Resp: 14 20  Temp: 98.3 F (36.8 C) 97.9 F (36.6 C)    Filed Weights   11/01/16 1653 11/02/16 0500 11/03/16 0622  Weight: 70 kg (154 lb 4.8 oz) 72.9 kg (160 lb 11.2 oz) 70.4 kg (155 lb 5 oz)    ROS: Review of Systems  Unable to perform ROS: Dementia  Respiratory: Negative for shortness of breath.   Cardiovascular: Negative for chest pain.  Gastrointestinal: Negative for abdominal pain.   Exam: Physical Exam  Constitutional: She appears lethargic.  HENT:  Nose: No mucosal edema.  Mouth/Throat: No oropharyngeal exudate or posterior oropharyngeal edema.  Eyes: Pupils are equal, round, and reactive to light. Conjunctivae and lids are normal.  Neck: No JVD present. Carotid bruit is not present. No edema present. No thyroid mass and no thyromegaly present.  Cardiovascular: S1 normal and S2 normal.  Exam reveals no gallop.   No murmur heard. Pulses:      Dorsalis pedis pulses are 2+ on the right side, and 2+ on the left side.  Respiratory: No respiratory distress. She has no wheezes. She has no rhonchi. She has no rales.  GI: Soft. Bowel sounds are normal. There is no tenderness.  Musculoskeletal:       Right ankle: She exhibits swelling.       Left ankle: She exhibits swelling.  Lymphadenopathy:    She has no cervical adenopathy.  Neurological: She appears lethargic.  Patient moves her upper  extremities on her own  Skin: Skin is warm. No rash noted. Nails show no clubbing.  Psychiatric:  Lethargic. Answered a few questions. Moves her upper extremities on her own      Data Reviewed: Basic Metabolic Panel:  Recent Labs Lab 11/01/16 1239 11/01/16 1400 11/03/16 0544  NA 138  --  139  K 3.7  --  3.3*  CL 99*  --  100*  CO2 24  --  28  GLUCOSE 121*  --  111*  BUN 16  --  16  CREATININE 1.00  --  1.00  CALCIUM 9.5  --  9.0  MG  --  2.3  --    Liver Function Tests:  Recent Labs Lab 11/01/16 1239  AST 36  ALT 25  ALKPHOS 90  BILITOT 0.5  PROT 7.5  ALBUMIN 3.8   CBC:  Recent Labs Lab 11/01/16 1236  WBC 7.5  HGB 11.4*  HCT 36.6  MCV 65.7*  PLT 251   Cardiac Enzymes:  Recent Labs Lab 11/01/16 1239  TROPONINI <0.03    CBG:  Recent Labs Lab 11/01/16 1309  GLUCAP 99    Recent Results (from the past 240 hour(s))  MRSA PCR Screening     Status: Abnormal   Collection Time: 11/01/16  9:32 PM  Result Value Ref Range Status   MRSA by PCR POSITIVE (A) NEGATIVE Final    Comment:  The GeneXpert MRSA Assay (FDA approved for NASAL specimens only), is one component of a comprehensive MRSA colonization surveillance program. It is not intended to diagnose MRSA infection nor to guide or monitor treatment for MRSA infections. RESULT CALLED TO, READ BACK BY AND VERIFIED WITH: CYNTHIA BURGESS @ 2349 ON 11/01/2016 BY CAF      Studies: Mr Laqueta JeanBrain W WUWo Contrast  Result Date: 11/01/2016 CLINICAL DATA:  Witnessed seizure at care facility. History of Alzheimer's disease, hypertension. EXAM: MRI HEAD WITHOUT AND WITH CONTRAST TECHNIQUE: Multiplanar, multiecho pulse sequences of the brain and surrounding structures were obtained without and with intravenous contrast. CONTRAST:  14mL MULTIHANCE GADOBENATE DIMEGLUMINE 529 MG/ML IV SOLN COMPARISON:  CT HEAD November 01, 2016 at 1247 hours and MRI of the head May 18, 2016 FINDINGS: INTRACRANIAL CONTENTS: No  reduced diffusion to suggest acute ischemia or hypercellular tumor. No susceptibility artifact to suggest hemorrhage. Moderate to severe ventriculomegaly, disproportionate mesial temporal lobe volume loss with mild ex vacuo dilatation subjacent ventricle. No hydrocephalus. Patchy supratentorial white matter FLAIR T2 hyperintensities are similar. No midline shift, mass effect or masses. Normal abnormal parenchymal or extra-axial enhancement. No abnormal extra-axial fluid collections. VASCULAR: Normal major intracranial vascular flow voids present at skull base. SKULL AND UPPER CERVICAL SPINE: No abnormal sellar expansion. No suspicious calvarial bone marrow signal. Craniocervical junction maintained. SINUSES/ORBITS: Moderate paranasal sinus mucosal thickening, status post FESS . Small mastoid effusions. The included ocular globes and orbital contents are non-suspicious. OTHER: None. IMPRESSION: 1. No acute intracranial process. 2. Stable parenchymal brain volume loss including disproportionate mesial temporal lobe atrophy seen with neurodegenerative disease. 3. Stable moderate chronic small vessel ischemic disease. Electronically Signed   By: Awilda Metroourtnay  Bloomer M.D.   On: 11/01/2016 19:36    Scheduled Meds: . ALPRAZolam  0.25 mg Oral TID  . arformoterol  15 mcg Nebulization BID  . aspirin EC  81 mg Oral Daily  . atorvastatin  20 mg Oral QHS  . budesonide  0.5 mg Nebulization BID  . Chlorhexidine Gluconate Cloth  6 each Topical Q0600  . cyclobenzaprine  5 mg Oral BID  . divalproex  250 mg Oral Q12H  . donepezil  10 mg Oral q morning - 10a  . enoxaparin (LOVENOX) injection  40 mg Subcutaneous Q24H  . ferrous sulfate  325 mg Oral Q breakfast  . fluticasone  2 spray Each Nare Daily  . furosemide  20 mg Oral Daily  . memantine  10 mg Oral BID  . montelukast  10 mg Oral q morning - 10a  . mupirocin ointment  1 application Nasal BID  . nortriptyline  20 mg Oral QHS  . predniSONE  5 mg Oral Q breakfast   . risperiDONE  0.5 mg Oral BID  . saccharomyces boulardii  250 mg Oral BID  . sertraline  100 mg Oral QHS      Assessment/Plan:  1. Acute encephalopathy. Unclear if this is secondary to the IV Depakote and was given yesterday or because the patient didn't take a nap yesterday and is sleeping more this morning. Continue to monitor.  2. Seizure disorder. Case discussed with Dr. Thad Rangereynolds neurology. Depakote dose decreased secondary to altered mental status today. EEG did not show any elective form activity and did have artifact secondary to movement 3. Dementia with behavioral disturbance and anxiety. Restarted Xanax yesterday. Continue Zoloft and nortriptyline. Patient also on Namenda and Aricept. 4. Asthma. Respiratory status stable continue nebulizer treatments.  5. Hyperlipidemia unspecified on atorvastatin 6. Essential hypertension.  Stop hydrochlorothiazide. Continue Lasix only   Code Status:     Code Status Orders        Start     Ordered   11/01/16 1529  Webster not attempt resuscitation (DNR)  Continuous    Question Answer Comment  In the event of cardiac or respiratory ARREST Webster not call a "code blue"   In the event of cardiac or respiratory ARREST Webster not perform Intubation, CPR, defibrillation or ACLS   In the event of cardiac or respiratory ARREST Use medication by any route, position, wound care, and other measures to relive pain and suffering. May use oxygen, suction and manual treatment of airway obstruction as needed for comfort.      11/01/16 1529    Code Status History    Date Active Date Inactive Code Status Order ID Comments User Context   This patient has a current code status but no historical code status.    Advance Directive Documentation     Most Recent Value  Type of Advance Directive  Out of facility DNR (pink MOST or yellow form)  Pre-existing out of facility DNR order (yellow form or pink MOST form)  Yellow form placed in chart (order not valid for  inpatient use)  "MOST" Form in Place?  -     Family Communication: Husband Earlier at the bedside. Daughter in the afternoon Disposition Plan: Potentially back to facility tomorrow  Consultants:  Neurology  Time spent: 28 minutes  Alford Highland  [employer]

## 2016-11-03 NOTE — Progress Notes (Signed)
Subjective: Patient lethargic.  Per nursing slept all night.  Depakote started on yesterday.  Last Xanax at around 9p last evening.  Received an extra dose of Xanax yesterday.    Objective: Current vital signs: BP 122/68 (BP Location: Right Arm)   Pulse 91   Temp 97.9 F (36.6 C) (Oral)   Resp 20   Ht 5\' 7"  (1.702 m)   Wt 70.4 kg (155 lb 5 oz)   SpO2 92%   BMI 24.33 kg/m  Vital signs in last 24 hours: Temp:  [97.9 F (36.6 C)-98.4 F (36.9 C)] 97.9 F (36.6 C) (07/12 1218) Pulse Rate:  [70-97] 91 (07/12 1218) Resp:  [14-20] 20 (07/12 1218) BP: (104-122)/(59-68) 122/68 (07/12 1218) SpO2:  [92 %-99 %] 92 % (07/12 1218) Weight:  [70.4 kg (155 lb 5 oz)] 70.4 kg (155 lb 5 oz) (07/12 0622)  Intake/Output from previous day: 07/11 0701 - 07/12 0700 In: 57.5 [IV Piggyback:57.5] Out: 700 [Urine:700] Intake/Output this shift: No intake/output data recorded. Nutritional status: Diet regular Room service appropriate? Yes; Fluid consistency: Thin  Neurologic Exam: Patient sleeping and only grimaces with sternal rub.  Does not localize to pain or verbalize.    Lab Results: Basic Metabolic Panel:  Recent Labs Lab 11/01/16 1239 11/01/16 1400 11/03/16 0544  NA 138  --  139  K 3.7  --  3.3*  CL 99*  --  100*  CO2 24  --  28  GLUCOSE 121*  --  111*  BUN 16  --  16  CREATININE 1.00  --  1.00  CALCIUM 9.5  --  9.0  MG  --  2.3  --     Liver Function Tests:  Recent Labs Lab 11/01/16 1239  AST 36  ALT 25  ALKPHOS 90  BILITOT 0.5  PROT 7.5  ALBUMIN 3.8   No results for input(s): LIPASE, AMYLASE in the last 168 hours. No results for input(s): AMMONIA in the last 168 hours.  CBC:  Recent Labs Lab 11/01/16 1236  WBC 7.5  HGB 11.4*  HCT 36.6  MCV 65.7*  PLT 251    Cardiac Enzymes:  Recent Labs Lab 11/01/16 1239  TROPONINI <0.03    Lipid Panel: No results for input(s): CHOL, TRIG, HDL, CHOLHDL, VLDL, LDLCALC in the last 168 hours.  CBG:  Recent  Labs Lab 11/01/16 1309  GLUCAP 99    Microbiology: Results for orders placed or performed during the hospital encounter of 11/01/16  MRSA PCR Screening     Status: Abnormal   Collection Time: 11/01/16  9:32 PM  Result Value Ref Range Status   MRSA by PCR POSITIVE (A) NEGATIVE Final    Comment:        The GeneXpert MRSA Assay (FDA approved for NASAL specimens only), is one component of a comprehensive MRSA colonization surveillance program. It is not intended to diagnose MRSA infection nor to guide or monitor treatment for MRSA infections. RESULT CALLED TO, READ BACK BY AND VERIFIED WITH: CYNTHIA BURGESS @ 2349 ON 11/01/2016 BY CAF     Coagulation Studies:  Recent Labs  11/01/16 1239  LABPROT 13.8  INR 1.06    Imaging: Ct Head Wo Contrast  Result Date: 11/01/2016 CLINICAL DATA:  Seizure. EXAM: CT HEAD WITHOUT CONTRAST TECHNIQUE: Contiguous axial images were obtained from the base of the skull through the vertex without intravenous contrast. COMPARISON:  CT scan of May 05, 2016.  MRI of May 18, 2016. FINDINGS: Brain: Mild diffuse cortical atrophy is  noted. Mild chronic ischemic white matter disease is noted. No mass effect or midline shift is noted. Ventricular size is within normal limits. There is no evidence of mass lesion, hemorrhage or acute infarction. Vascular: No hyperdense vessel or unexpected calcification. Skull: Normal. Negative for fracture or focal lesion. Sinuses/Orbits: Mild left maxillary sinusitis is noted. Status post bilateral maxillary antrostomies. Other: None. IMPRESSION: Mild diffuse cortical atrophy. Mild chronic ischemic white matter disease. No acute intracranial abnormality seen. Electronically Signed   By: Lupita RaiderJames  Green Jr, M.D.   On: 11/01/2016 13:02   Mr Laqueta JeanBrain W GNWo Contrast  Result Date: 11/01/2016 CLINICAL DATA:  Witnessed seizure at care facility. History of Alzheimer's disease, hypertension. EXAM: MRI HEAD WITHOUT AND WITH CONTRAST  TECHNIQUE: Multiplanar, multiecho pulse sequences of the brain and surrounding structures were obtained without and with intravenous contrast. CONTRAST:  14mL MULTIHANCE GADOBENATE DIMEGLUMINE 529 MG/ML IV SOLN COMPARISON:  CT HEAD November 01, 2016 at 1247 hours and MRI of the head May 18, 2016 FINDINGS: INTRACRANIAL CONTENTS: No reduced diffusion to suggest acute ischemia or hypercellular tumor. No susceptibility artifact to suggest hemorrhage. Moderate to severe ventriculomegaly, disproportionate mesial temporal lobe volume loss with mild ex vacuo dilatation subjacent ventricle. No hydrocephalus. Patchy supratentorial white matter FLAIR T2 hyperintensities are similar. No midline shift, mass effect or masses. Normal abnormal parenchymal or extra-axial enhancement. No abnormal extra-axial fluid collections. VASCULAR: Normal major intracranial vascular flow voids present at skull base. SKULL AND UPPER CERVICAL SPINE: No abnormal sellar expansion. No suspicious calvarial bone marrow signal. Craniocervical junction maintained. SINUSES/ORBITS: Moderate paranasal sinus mucosal thickening, status post FESS . Small mastoid effusions. The included ocular globes and orbital contents are non-suspicious. OTHER: None. IMPRESSION: 1. No acute intracranial process. 2. Stable parenchymal brain volume loss including disproportionate mesial temporal lobe atrophy seen with neurodegenerative disease. 3. Stable moderate chronic small vessel ischemic disease. Electronically Signed   By: Awilda Metroourtnay  Bloomer M.D.   On: 11/01/2016 19:36    Medications:  I have reviewed the patient's current medications. Scheduled: . ALPRAZolam  0.25 mg Oral TID  . arformoterol  15 mcg Nebulization BID  . aspirin EC  81 mg Oral Daily  . atorvastatin  20 mg Oral QHS  . budesonide  0.5 mg Nebulization BID  . Chlorhexidine Gluconate Cloth  6 each Topical Q0600  . cyclobenzaprine  5 mg Oral BID  . divalproex  250 mg Oral Q12H  . donepezil  10 mg  Oral q morning - 10a  . enoxaparin (LOVENOX) injection  40 mg Subcutaneous Q24H  . ferrous sulfate  325 mg Oral Q breakfast  . fluticasone  2 spray Each Nare Daily  . furosemide  20 mg Oral Daily  . memantine  10 mg Oral BID  . montelukast  10 mg Oral q morning - 10a  . mupirocin ointment  1 application Nasal BID  . nortriptyline  20 mg Oral QHS  . predniSONE  5 mg Oral Q breakfast  . risperiDONE  0.5 mg Oral BID  . saccharomyces boulardii  250 mg Oral BID  . sertraline  100 mg Oral QHS    Assessment/Plan: No further seizures noted.  Patient lethargic.  Depakote level 56 and although lethargy unlikely secondary to Depakote will decrease dosage.    Recommendations: 1.  Decrease Depakote to 250mg  BID.     LOS: 0 days   Thana FarrLeslie Mahira Petras, MD Neurology [phone] 11/03/2016  12:54 PM

## 2016-11-03 NOTE — Clinical Social Work Note (Signed)
CSW consulted as pt was admitted from [facility]. CSW spoke with [facility]. Pt is able to return when stable. CSW updated MD. Full Assessment to follow. CSW will continue to follow.   Dede QuerySarah Haneen Bernales, MSW, LCSW  Clinical Social Worker  [phone]

## 2016-11-04 DIAGNOSIS — R569 Unspecified convulsions: Secondary | ICD-10-CM | POA: Diagnosis not present

## 2016-11-04 DIAGNOSIS — G40909 Epilepsy, unspecified, not intractable, without status epilepticus: Secondary | ICD-10-CM | POA: Diagnosis not present

## 2016-11-04 LAB — BASIC METABOLIC PANEL
ANION GAP: 12 (ref 5–15)
BUN: 23 mg/dL — AB (ref 6–20)
CO2: 27 mmol/L (ref 22–32)
Calcium: 9.4 mg/dL (ref 8.9–10.3)
Chloride: 100 mmol/L — ABNORMAL LOW (ref 101–111)
Creatinine, Ser: 0.88 mg/dL (ref 0.44–1.00)
GFR calc Af Amer: >60 mL/min (ref >60)
GFR calc non Af Amer: >60 mL/min (ref >60)
GLUCOSE: 140 mg/dL — AB (ref 65–99)
POTASSIUM: 4.3 mmol/L (ref 3.5–5.1)
Sodium: 139 mmol/L (ref 135–145)

## 2016-11-04 LAB — VALPROIC ACID LEVEL: Valproic Acid Lvl: 66 ug/mL (ref 50.0–100.0)

## 2016-11-04 LAB — MAGNESIUM: Magnesium: 2.1 mg/dL (ref 1.7–2.4)

## 2016-11-04 MED ORDER — ALPRAZOLAM 0.25 MG PO TABS
0.2500 mg | ORAL_TABLET | Freq: Two times a day (BID) | ORAL | 0 refills | Status: DC
Start: 2016-11-04 — End: 2017-01-08

## 2016-11-04 MED ORDER — DIVALPROEX SODIUM 250 MG PO DR TAB: 250.0000 mg | DELAYED_RELEASE_TABLET | Freq: Two times a day (BID) | ORAL | 0 refills | Status: AC

## 2016-11-04 NOTE — Progress Notes (Signed)
Subjective: Patient awake this morning to eat breakfast.  Went into a deep slee afterward but awakened when bedding was changed and remains awake at this time.  No further seizures noted.   Objective: Current vital signs: BP 139/68 (BP Location: Left Arm)   Pulse 91   Temp 98 F (36.7 C) (Oral)   Resp 20   Ht 5\' 7"  (1.702 m)   Wt 71.1 kg (156 lb 12.8 oz)   SpO2 96%   BMI 24.56 kg/m  Vital signs in last 24 hours: Temp:  [98 F (36.7 C)] 98 F (36.7 C) (07/13 1210) Pulse Rate:  [91-97] 91 (07/13 1210) Resp:  [20] 20 (07/13 1210) BP: (114-139)/(66-68) 139/68 (07/13 1210) SpO2:  [93 %-96 %] 96 % (07/13 1210) Weight:  [71.1 kg (156 lb 12.8 oz)] 71.1 kg (156 lb 12.8 oz) (07/13 0500)  Intake/Output from previous day: 07/12 0701 - 07/13 0700 In: -  Out: 225 [Urine:225] Intake/Output this shift: No intake/output data recorded. Nutritional status: Diet regular Room service appropriate? Yes; Fluid consistency: Thin  Neurologic Exam: Mental Status: Alert.  Speech slurred.  Able to follow some simple commands. Cranial Nerves: II: Discs flat bilaterally; Visual fields grossly normal, pupils equal, round, reactive to light and accommodation  Motor: Moves upper extremities spontaneously  Sensory: Pinprick and light touch intact throughout, bilaterally   Lab Results: Basic Metabolic Panel:  Recent Labs Lab 11/01/16 1239 11/01/16 1400 11/03/16 0544 11/04/16 0954  NA 138  --  139 139  K 3.7  --  3.3* 4.3  CL 99*  --  100* 100*  CO2 24  --  28 27  GLUCOSE 121*  --  111* 140*  BUN 16  --  16 23*  CREATININE 1.00  --  1.00 0.88  CALCIUM 9.5  --  9.0 9.4  MG  --  2.3  --  2.1    Liver Function Tests:  Recent Labs Lab 11/01/16 1239  AST 36  ALT 25  ALKPHOS 90  BILITOT 0.5  PROT 7.5  ALBUMIN 3.8   No results for input(s): LIPASE, AMYLASE in the last 168 hours. No results for input(s): AMMONIA in the last 168 hours.  CBC:  Recent Labs Lab 11/01/16 1236  WBC  7.5  HGB 11.4*  HCT 36.6  MCV 65.7*  PLT 251    Cardiac Enzymes:  Recent Labs Lab 11/01/16 1239  TROPONINI <0.03    Lipid Panel: No results for input(s): CHOL, TRIG, HDL, CHOLHDL, VLDL, LDLCALC in the last 168 hours.  CBG:  Recent Labs Lab 11/01/16 1309  GLUCAP 99    Microbiology: Results for orders placed or performed during the hospital encounter of 11/01/16  MRSA PCR Screening     Status: Abnormal   Collection Time: 11/01/16  9:32 PM  Result Value Ref Range Status   MRSA by PCR POSITIVE (A) NEGATIVE Final    Comment:        The GeneXpert MRSA Assay (FDA approved for NASAL specimens only), is one component of a comprehensive MRSA colonization surveillance program. It is not intended to diagnose MRSA infection nor to guide or monitor treatment for MRSA infections. RESULT CALLED TO, READ BACK BY AND VERIFIED WITH: CYNTHIA BURGESS @ 2349 ON 11/01/2016 BY CAF     Coagulation Studies: No results for input(s): LABPROT, INR in the last 72 hours.  Imaging: No results found.  Medications:  I have reviewed the patient's current medications. Scheduled: . ALPRAZolam  0.25 mg Oral TID  .  arformoterol  15 mcg Nebulization BID  . aspirin EC  81 mg Oral Daily  . atorvastatin  20 mg Oral QHS  . budesonide  0.5 mg Nebulization BID  . Chlorhexidine Gluconate Cloth  6 each Topical Q0600  . divalproex  250 mg Oral Q12H  . donepezil  10 mg Oral q morning - 10a  . enoxaparin (LOVENOX) injection  40 mg Subcutaneous Q24H  . ferrous sulfate  325 mg Oral Q breakfast  . fluticasone  2 spray Each Nare Daily  . furosemide  20 mg Oral Daily  . memantine  10 mg Oral BID  . montelukast  10 mg Oral q morning - 10a  . mupirocin ointment  1 application Nasal BID  . nortriptyline  20 mg Oral QHS  . predniSONE  5 mg Oral Q breakfast  . risperiDONE  0.5 mg Oral BID  . saccharomyces boulardii  250 mg Oral BID  . sertraline  100 mg Oral QHS    Assessment/Plan: Patient still  sleeping deeply at times but more arousable than on yesterday.  No further seizures noted.  EEG unremarkable.  VPA level 66 this AM.  Depakote decreased to 250mg  BID.    Recommendations: 1.  Decrease Xanax to BID dosing from TID dosing 2.  Continue Depakote at 250mg  BID 3.  Patient to continue follow up with neurology on an outpatient basis.         LOS: 0 days   Thana Farr, MD Neurology [phone] 11/04/2016  4:32 PM

## 2016-11-04 NOTE — Progress Notes (Signed)
Discharge instructions given and went over with patient, patients husband and daughter at bedside. Prescriptions given and reviewed. All questions answered. Patient discharged back to [facility] - memory care via wheelchair by nursing staff. Bo McclintockBrewer,Francene Mcerlean S, RN

## 2016-11-04 NOTE — NC FL2 (Signed)
[place] MEDICAID FL2 LEVEL OF CARE SCREENING TOOL     IDENTIFICATION  Patient Name: Jessica Webster Birthdate: [birth year] Sex: female Admission Date (Current Location): 11/01/2016  [place] and [place] Number:  Chiropodist and Address:  [facility], [address], [place], [place] [address]      Provider Number: [id number]  Attending Physician Name and Address:  Alford Highland, MD  Relative Name and Phone Number:       Current Level of Care: Hospital Recommended Level of Care: Memory Care Prior Approval Number:    Date Approved/Denied:   PASRR Number:    Discharge Plan: Domiciliary (Rest home)    Current Diagnoses: Patient Active Problem List   Diagnosis Date Noted  . Seizure (HCC) 11/01/2016    Orientation RESPIRATION BLADDER Height & Weight     Self  Normal Incontinent Weight: 156 lb 12.8 oz (71.1 kg) Height:  5\' 7"  (170.2 cm)  BEHAVIORAL SYMPTOMS/MOOD NEUROLOGICAL BOWEL NUTRITION STATUS      Incontinent Diet (Regular Diet, Thin Liquids)  AMBULATORY STATUS COMMUNICATION OF NEEDS Skin   Limited Assist Verbally Normal                       Personal Care Assistance Level of Assistance  Bathing, Feeding, Dressing Bathing Assistance: Limited assistance Feeding assistance: Independent Dressing Assistance: Limited assistance     Functional Limitations Info  Sight, Hearing, Speech Sight Info: Adequate Hearing Info: Adequate Speech Info: Adequate    SPECIAL CARE FACTORS FREQUENCY                       Contractures Contractures Info: Not present    Additional Factors Info  Code Status, Allergies, Psychotropic Code Status Info: DNR Allergies Info: Erythromycin, Sulfa Antibiotics Psychotropic Info: Medications:  Depakote, Zoloft, Xanax, Risperdal         Discharge Medications: Please see discharge summary for a list of discharge medications. Current Discharge Medication List        START taking these  medications   Details  divalproex (DEPAKOTE) 250 MG DR tablet Take 1 tablet (250 mg total) by mouth every 12 (twelve) hours. Qty: 60 tablet, Refills: 0          CONTINUE these medications which have CHANGED   Details  ALPRAZolam (XANAX) 0.25 MG tablet Take 1 tablet (0.25 mg total) by mouth 2 (two) times daily. Qty: 60 tablet, Refills: 0          CONTINUE these medications which have NOT CHANGED   Details  arformoterol (BROVANA) 15 MCG/2ML NEBU Take 2 mLs (15 mcg total) by nebulization 2 (two) times daily. Qty: 120 mL, Refills: 6    aspirin EC 81 MG tablet Take 81 mg by mouth daily.    atorvastatin (LIPITOR) 20 MG tablet Take 20 mg by mouth at bedtime.    donepezil (ARICEPT) 10 MG tablet Take 10 mg by mouth every morning.     ferrous sulfate 325 (65 FE) MG tablet Take 325 mg by mouth daily with breakfast.    fluticasone (FLONASE) 50 MCG/ACT nasal spray Place 2 sprays into both nostrils daily.    furosemide (LASIX) 20 MG tablet Take 20 mg by mouth.    loperamide (IMODIUM) 2 MG capsule Take 2 mg by mouth as needed for diarrhea or loose stools.    memantine (NAMENDA) 10 MG tablet Take 10 mg by mouth 2 (two) times daily.    montelukast (SINGULAIR) 10 MG  tablet Take 10 mg by mouth every morning.     nortriptyline (PAMELOR) 10 MG capsule Take 20 mg by mouth at bedtime.     omeprazole (PRILOSEC) 20 MG capsule Take 20 mg by mouth daily.    predniSONE (DELTASONE) 5 MG tablet Take 1 tablet (5 mg total) by mouth daily with breakfast. Qty: 30 tablet, Refills: 5    risperiDONE (RISPERDAL) 0.5 MG tablet Take 0.5 mg by mouth 2 (two) times daily.    saccharomyces boulardii (FLORASTOR) 250 MG capsule Take 250 mg by mouth 2 (two) times daily.    sertraline (ZOLOFT) 100 MG tablet Take 100 mg by mouth at bedtime.     budesonide (PULMICORT) 0.5 MG/2ML nebulizer solution Take 2 mLs (0.5 mg total) by nebulization 2 (two) times daily. Qty: 120 mL, Refills: 10     ipratropium-albuterol (DUONEB) 0.5-2.5 (3) MG/3ML SOLN Inhale 3 mLs into the lungs every 4 (four) hours as needed. Qty: 540 mL, Refills: 11    polyethylene glycol (MIRALAX / GLYCOLAX) packet Take 17 g by mouth daily as needed.          STOP taking these medications     cyclobenzaprine (FLEXERIL) 5 MG tablet      hydrochlorothiazide (HYDRODIURIL) 12.5 MG tablet       Relevant Imaging Results:  Relevant Lab Results:   Additional Information SSN:  [id number]  Dede QuerySarah Maaliyah Adolph, LCSW

## 2016-11-04 NOTE — Discharge Instructions (Signed)
Seizure, Adult °A seizure is a sudden burst of abnormal electrical activity in the brain. The abnormal activity temporarily interrupts normal brain function, causing a person to experience any of the following: °· Involuntary movements. °· Changes in awareness or consciousness. °· Uncontrollable shaking (convulsions). ° °Seizures usually last from 30 seconds to 2 minutes. They usually do not cause permanent brain damage unless they are prolonged. °What can cause a seizure to happen? °Seizures can happen for many reasons including: °· A fever. °· Low blood sugar. °· A medicine. °· An illnesses. °· A brain injury. ° °Some people who have a seizure never have another one. People who have repeated seizures have a condition called epilepsy. °What are the symptoms of a seizure? °Symptoms of a seizure vary greatly from person to person. They include: °· Convulsions. °· Stiffening of the body. °· Involuntary movements of the arms or legs. °· Loss of consciousness. °· Breathing problems. °· Falling suddenly. °· Confusion. °· Head nodding. °· Eye blinking or fluttering. °· Lip smacking. °· Drooling. °· Rapid eye movements. °· Grunting. °· Loss of bladder control and bowel control. °· Staring. °· Unresponsiveness. ° °Some people have symptoms right before a seizure happens (aura) and right after a seizure happens. Symptoms of an aura include: °· Fear or anxiety. °· Nausea. °· Feeling like the room is spinning (vertigo). °· A feeling of having seen or heard something before (deja vu). °· Odd tastes or smells. °· Changes in vision, such as seeing flashing lights or spots. ° °Symptoms that may follow a seizure include: °· Confusion. °· Sleepiness. °· Headache. °· Weakness of one side of the body. ° °Follow these instructions at home: °Medicines ° °· Take over-the-counter and prescription medicines only as told by your health care provider. °· Avoid any substances that may prevent your medicine from working properly, such as  alcohol. °Activity °· Do not drive, swim, or do any other activities that would be dangerous if you had another seizure. Wait until your health care provider approves. °· If you live in the [place] check with your local DMV (department of motor vehicles) to find out about the local driving laws. Each state has specific rules about when you can legally return to driving. °· Get enough rest. Lack of sleep can make seizures more likely to occur. °Educating others °Teach friends and family what to do if you have a seizure. They should: °· Lay you on the ground to prevent a fall. °· Cushion your head and body. °· Loosen any tight clothing around your neck. °· Turn you on your side. If vomiting occurs, this helps keep your airway clear. °· Stay with you until you recover. °· Not hold you down. Holding you down will not stop the seizure. °· Not put anything in your mouth. °· Know whether or not you need emergency care. ° °General instructions °· Contact your health care provider each time you have a seizure. °· Avoid anything that has ever triggered a seizure for you. °· Keep a seizure diary. Record what you remember about each seizure, especially anything that might have triggered the seizure. °· Keep all follow-up visits as told by your health care provider. This is important. °Contact a health care provider if: °· You have another seizure. °· You have seizures more often. °· Your seizure symptoms change. °· You continue to have seizures with treatment. °· You have symptoms of an infection or illness. They might increase your risk of having a seizure. °Get help   right away if: °· You have a seizure: °? That lasts longer than 5 minutes. °? That is different than previous seizures. °? That leaves you unable to speak or use a part of your body. °? That makes it harder to breathe. °? After a head injury. °· You have: °? Multiple seizures in a row. °? Confusion or a severe headache right after a seizure. °· You are having  seizures more often. °· You do not wake up immediately after a seizure. °· You injure yourself during a seizure. °These symptoms may represent a serious problem that is an emergency. Do not wait to see if the symptoms will go away. Get medical help right away. Call your local emergency services (911 in the [place] Do not drive yourself to the hospital. °This information is not intended to replace advice given to you by your health care provider. Make sure you discuss any questions you have with your health care provider. °Document Released: 04/08/2000 Document Revised: 12/06/2015 Document Reviewed: 11/13/2015 °Elsevier Interactive Patient Education © 2017 [employer] ° °

## 2016-11-04 NOTE — Discharge Summary (Addendum)
[facility] - [place] at [facility]   PATIENT NAME: Jessica Webster    MR#:  [id number]  DATE OF BIRTH:  1946/02/27  DATE OF ADMISSION:  11/01/2016 ADMITTING PHYSICIAN: Jessica Loll, MD  DATE OF DISCHARGE: 11/04/2016  PRIMARY CARE PHYSICIAN: Jessica Pu, Arturo Morton, DO    ADMISSION DIAGNOSIS:  Seizure (HCC) [R56.9]  DISCHARGE DIAGNOSIS:  Active Problems:   Seizure (HCC)   SECONDARY DIAGNOSIS:   Past Medical History:  Diagnosis Date  . Alzheimer disease   . Anxiety   . Asthma   . Hypertension     HOSPITAL COURSE:   1. Seizure. Patient was loaded with IV Depakote and then oral Depakote. Oral Depakote was decreased in dose secondary to patient being more sleepy. Depakote level in the normal range. Patient was seen by neurology in the hospital. Continue Depakote upon discharge home. 2. Acute encephalopathy. Likely combination of receiving IV Depakote and Xanax. The patient does have a history of dementia. Patient was up and alert and eating meals on the day of discharge. Stop Flexeril for now. Xanax dose decreased to twice a day instead of 3 times a day. 3. Dementia with behavioral disturbance and anxiety. Depakote should also help out with mood stabilization. Continue Zoloft, nortriptyline and Risperidal. Patient is a DO NOT RESUSCITATE. 4. Asthma. Respiratory status stable on nebulizer treatments. On chronic prednisone for chronic bronchitis 5. Hyperlipidemia unspecified on atorvastatin 6. Essential hypertension. Stop or drug or thiazide. Continue Lasix only  DISCHARGE CONDITIONS:   Satisfactory  CONSULTS OBTAINED:  Treatment Team:  Thana Farr, MD  DRUG ALLERGIES:   Allergies  Allergen Reactions  . Erythromycin Hives  . Sulfa Antibiotics Hives    DISCHARGE MEDICATIONS:   Current Discharge Medication List    START taking these medications   Details  divalproex (DEPAKOTE) 250 MG DR tablet Take 1 tablet (250 mg total) by mouth every 12 (twelve)  hours. Qty: 60 tablet, Refills: 0      CONTINUE these medications which have CHANGED   Details  ALPRAZolam (XANAX) 0.25 MG tablet Take 1 tablet (0.25 mg total) by mouth 2 (two) times daily. Qty: 60 tablet, Refills: 0      CONTINUE these medications which have NOT CHANGED   Details  arformoterol (BROVANA) 15 MCG/2ML NEBU Take 2 mLs (15 mcg total) by nebulization 2 (two) times daily. Qty: 120 mL, Refills: 6    aspirin EC 81 MG tablet Take 81 mg by mouth daily.    atorvastatin (LIPITOR) 20 MG tablet Take 20 mg by mouth at bedtime.    donepezil (ARICEPT) 10 MG tablet Take 10 mg by mouth every morning.     ferrous sulfate 325 (65 FE) MG tablet Take 325 mg by mouth daily with breakfast.    fluticasone (FLONASE) 50 MCG/ACT nasal spray Place 2 sprays into both nostrils daily.    furosemide (LASIX) 20 MG tablet Take 20 mg by mouth.    loperamide (IMODIUM) 2 MG capsule Take 2 mg by mouth as needed for diarrhea or loose stools.    memantine (NAMENDA) 10 MG tablet Take 10 mg by mouth 2 (two) times daily.    montelukast (SINGULAIR) 10 MG tablet Take 10 mg by mouth every morning.     nortriptyline (PAMELOR) 10 MG capsule Take 20 mg by mouth at bedtime.     omeprazole (PRILOSEC) 20 MG capsule Take 20 mg by mouth daily.    predniSONE (DELTASONE) 5 MG tablet Take 1 tablet (5 mg total) by mouth  daily with breakfast. Qty: 30 tablet, Refills: 5    risperiDONE (RISPERDAL) 0.5 MG tablet Take 0.5 mg by mouth 2 (two) times daily.    saccharomyces boulardii (FLORASTOR) 250 MG capsule Take 250 mg by mouth 2 (two) times daily.    sertraline (ZOLOFT) 100 MG tablet Take 100 mg by mouth at bedtime.     budesonide (PULMICORT) 0.5 MG/2ML nebulizer solution Take 2 mLs (0.5 mg total) by nebulization 2 (two) times daily. Qty: 120 mL, Refills: 10    ipratropium-albuterol (DUONEB) 0.5-2.5 (3) MG/3ML SOLN Inhale 3 mLs into the lungs every 4 (four) hours as needed. Qty: 540 mL, Refills: 11     polyethylene glycol (MIRALAX / GLYCOLAX) packet Take 17 g by mouth daily as needed.       STOP taking these medications     cyclobenzaprine (FLEXERIL) 5 MG tablet      hydrochlorothiazide (HYDRODIURIL) 12.5 MG tablet          DISCHARGE INSTRUCTIONS:   Follow-up PMD one week Follow-up in neurology 2 weeks  If you experience worsening of your admission symptoms, develop shortness of breath, life threatening emergency, suicidal or homicidal thoughts you must seek medical attention immediately by calling 911 or calling your MD immediately  if symptoms less severe.  You Must read complete instructions/literature along with all the possible adverse reactions/side effects for all the Medicines you take and that have been prescribed to you. Take any new Medicines after you have completely understood and accept all the possible adverse reactions/side effects.   Please note  You were cared for by a hospitalist during your hospital stay. If you have any questions about your discharge medications or the care you received while you were in the hospital after you are discharged, you can call the unit and asked to speak with the hospitalist on call if the hospitalist that took care of you is not available. Once you are discharged, your primary care physician will handle any further medical issues. Please note that NO REFILLS for any discharge medications will be authorized once you are discharged, as it is imperative that you return to your primary care physician (or establish a relationship with a primary care physician if you do not have one) for your aftercare needs so that they can reassess your need for medications and monitor your lab values.    Today   CHIEF COMPLAINT:   Chief Complaint  Patient presents with  . Seizures    HISTORY OF PRESENT ILLNESS:  Jessica Webster  is a 71 y.o. female with a known history of Dementia presented with a seizure   VITAL SIGNS:  Blood pressure 139/68,  pulse 91, temperature 98 F (36.7 C), temperature source Oral, resp. rate 20, height 5\' 7"  (1.702 m), weight 71.1 kg (156 lb 12.8 oz), SpO2 96 %.  I/O:  No intake or output data in the 24 hours ending 11/04/16 1308  PHYSICAL EXAMINATION:  GENERAL:  71 y.o.-year-old patient lying in the bed with no acute distress.  EYES: Pupils equal, round, reactive to light and accommodation. No scleral icterus.  HEENT: Head atraumatic, normocephalic. Oropharynx and nasopharynx clear.  NECK:  Supple, no jugular venous distention. No thyroid enlargement, no tenderness.  LUNGS: Normal breath sounds bilaterally, no wheezing, rales,rhonchi or crepitation. No use of accessory muscles of respiration.  CARDIOVASCULAR: S1, S2 normal. No murmurs, rubs, or gallops.  ABDOMEN: Soft, non-tender, non-distended. Bowel sounds present. No organomegaly or mass.  EXTREMITIES: Trace edema, no cyanosis, or clubbing.  NEUROLOGIC: Patient able to move all extremities PSYCHIATRIC: The patient is alert.  SKIN: No obvious rash, lesion, or ulcer.   DATA REVIEW:   CBC  Recent Labs Lab 11/01/16 1236  WBC 7.5  HGB 11.4*  HCT 36.6  PLT 251    Chemistries   Recent Labs Lab 11/01/16 1239  11/04/16 0954  NA 138  < > 139  K 3.7  < > 4.3  CL 99*  < > 100*  CO2 24  < > 27  GLUCOSE 121*  < > 140*  BUN 16  < > 23*  CREATININE 1.00  < > 0.88  CALCIUM 9.5  < > 9.4  MG  --   < > 2.1  AST 36  --   --   ALT 25  --   --   ALKPHOS 90  --   --   BILITOT 0.5  --   --   < > = values in this interval not displayed.  Cardiac Enzymes  Recent Labs Lab 11/01/16 1239  TROPONINI <0.03    Microbiology Results  Results for orders placed or performed during the hospital encounter of 11/01/16  MRSA PCR Screening     Status: Abnormal   Collection Time: 11/01/16  9:32 PM  Result Value Ref Range Status   MRSA by PCR POSITIVE (A) NEGATIVE Final    Comment:        The GeneXpert MRSA Assay (FDA approved for NASAL  specimens only), is one component of a comprehensive MRSA colonization surveillance program. It is not intended to diagnose MRSA infection nor to guide or monitor treatment for MRSA infections. RESULT CALLED TO, READ BACK BY AND VERIFIED WITH: CYNTHIA BURGESS @ 2349 ON 11/01/2016 BY CAF        Management plans discussed with the patient, family and they are in agreement.  CODE STATUS:     Code Status Orders        Start     Ordered   11/01/16 1529  Do not attempt resuscitation (DNR)  Continuous    Question Answer Comment  In the event of cardiac or respiratory ARREST Do not call a "code blue"   In the event of cardiac or respiratory ARREST Do not perform Intubation, CPR, defibrillation or ACLS   In the event of cardiac or respiratory ARREST Use medication by any route, position, wound care, and other measures to relive pain and suffering. May use oxygen, suction and manual treatment of airway obstruction as needed for comfort.      11/01/16 1529    Code Status History    Date Active Date Inactive Code Status Order ID Comments User Context   This patient has a current code status but no historical code status.    Advance Directive Documentation     Most Recent Value  Type of Advance Directive  Out of facility DNR (pink MOST or yellow form)  Pre-existing out of facility DNR order (yellow form or pink MOST form)  Yellow form placed in chart (order not valid for inpatient use)  "MOST" Form in Place?  -      TOTAL TIME TAKING CARE OF THIS PATIENT: 35 minutes.    Alford Highland M.D on 11/04/2016 at 1:08 PM  Between 7am to 6pm - Pager - [phone]  After 6pm go to [email or web address] - password [email or web address]  [employer] [facility] Office  [phone]  CC: Primary care physician; Jessica Pu, Arturo Morton, DO

## 2016-11-04 NOTE — Clinical Social Work Note (Signed)
Pt is ready for discharge today and will [facility]. Pt's husband is aware and agreeable to discharge plan. Pt's husband will provide transportation. Facility is ready to admit pt. RN is aware. CSW is signing off as no further needs identified.   Dede QuerySarah Inza Mikrut, MSW, LCSW  Clinical Social Worker  [phone]

## 2016-11-23 ENCOUNTER — Encounter: Payer: Self-pay | Admitting: *Deleted

## 2016-11-23 ENCOUNTER — Inpatient Hospital Stay
Admission: EM | Admit: 2016-11-23 | Discharge: 2016-11-28 | DRG: 193 | Disposition: A | Discharge status: Home / Self Care | Payer: Medicare Other | Attending: Internal Medicine | Admitting: Internal Medicine

## 2016-11-23 ENCOUNTER — Emergency Department: Payer: Medicare Other

## 2016-11-23 DIAGNOSIS — J45909 Unspecified asthma, uncomplicated: Secondary | ICD-10-CM | POA: Diagnosis present

## 2016-11-23 DIAGNOSIS — Z993 Dependence on wheelchair: Secondary | ICD-10-CM | POA: Diagnosis not present

## 2016-11-23 DIAGNOSIS — Z66 Do not resuscitate: Secondary | ICD-10-CM | POA: Diagnosis present

## 2016-11-23 DIAGNOSIS — K219 Gastro-esophageal reflux disease without esophagitis: Secondary | ICD-10-CM | POA: Diagnosis present

## 2016-11-23 DIAGNOSIS — F329 Major depressive disorder, single episode, unspecified: Secondary | ICD-10-CM | POA: Diagnosis present

## 2016-11-23 DIAGNOSIS — J42 Unspecified chronic bronchitis: Secondary | ICD-10-CM | POA: Diagnosis present

## 2016-11-23 DIAGNOSIS — G9341 Metabolic encephalopathy: Secondary | ICD-10-CM | POA: Diagnosis present

## 2016-11-23 DIAGNOSIS — Z882 Allergy status to sulfonamides status: Secondary | ICD-10-CM | POA: Diagnosis not present

## 2016-11-23 DIAGNOSIS — Z881 Allergy status to other antibiotic agents status: Secondary | ICD-10-CM | POA: Diagnosis not present

## 2016-11-23 DIAGNOSIS — E785 Hyperlipidemia, unspecified: Secondary | ICD-10-CM | POA: Diagnosis present

## 2016-11-23 DIAGNOSIS — R400 Somnolence: Secondary | ICD-10-CM | POA: Diagnosis not present

## 2016-11-23 DIAGNOSIS — G40909 Epilepsy, unspecified, not intractable, without status epilepticus: Secondary | ICD-10-CM | POA: Diagnosis present

## 2016-11-23 DIAGNOSIS — Z515 Encounter for palliative care: Secondary | ICD-10-CM

## 2016-11-23 DIAGNOSIS — I10 Essential (primary) hypertension: Secondary | ICD-10-CM | POA: Diagnosis present

## 2016-11-23 DIAGNOSIS — J189 Pneumonia, unspecified organism: Secondary | ICD-10-CM | POA: Diagnosis present

## 2016-11-23 DIAGNOSIS — F0281 Dementia in other diseases classified elsewhere with behavioral disturbance: Secondary | ICD-10-CM | POA: Diagnosis present

## 2016-11-23 DIAGNOSIS — G309 Alzheimer's disease, unspecified: Secondary | ICD-10-CM | POA: Diagnosis present

## 2016-11-23 DIAGNOSIS — Z7189 Other specified counseling: Secondary | ICD-10-CM

## 2016-11-23 DIAGNOSIS — Z7952 Long term (current) use of systemic steroids: Secondary | ICD-10-CM | POA: Diagnosis not present

## 2016-11-23 DIAGNOSIS — F419 Anxiety disorder, unspecified: Secondary | ICD-10-CM | POA: Diagnosis present

## 2016-11-23 DIAGNOSIS — R509 Fever, unspecified: Secondary | ICD-10-CM

## 2016-11-23 DIAGNOSIS — Z87891 Personal history of nicotine dependence: Secondary | ICD-10-CM

## 2016-11-23 DIAGNOSIS — J181 Lobar pneumonia, unspecified organism: Secondary | ICD-10-CM

## 2016-11-23 DIAGNOSIS — R4182 Altered mental status, unspecified: Secondary | ICD-10-CM | POA: Diagnosis present

## 2016-11-23 LAB — URINALYSIS, ROUTINE W REFLEX MICROSCOPIC
Bacteria, UA: NONE SEEN
Bilirubin Urine: NEGATIVE
GLUCOSE, UA: NEGATIVE mg/dL
Hgb urine dipstick: NEGATIVE
Ketones, ur: NEGATIVE mg/dL
Nitrite: NEGATIVE
PH: 6 (ref 5.0–8.0)
Protein, ur: 30 mg/dL — AB
SPECIFIC GRAVITY, URINE: 1.026 (ref 1.005–1.030)

## 2016-11-23 LAB — COMPREHENSIVE METABOLIC PANEL
ALT: 17 U/L (ref 14–54)
ANION GAP: 10 (ref 5–15)
AST: 20 U/L (ref 15–41)
Albumin: 3.6 g/dL (ref 3.5–5.0)
Alkaline Phosphatase: 100 U/L (ref 38–126)
BUN: 14 mg/dL (ref 6–20)
CHLORIDE: 103 mmol/L (ref 101–111)
CO2: 25 mmol/L (ref 22–32)
CREATININE: 0.61 mg/dL (ref 0.44–1.00)
Calcium: 9.1 mg/dL (ref 8.9–10.3)
Glucose, Bld: 109 mg/dL — ABNORMAL HIGH (ref 65–99)
Potassium: 3.5 mmol/L (ref 3.5–5.1)
SODIUM: 138 mmol/L (ref 135–145)
Total Bilirubin: 0.6 mg/dL (ref 0.3–1.2)
Total Protein: 7.5 g/dL (ref 6.5–8.1)

## 2016-11-23 LAB — PROCALCITONIN

## 2016-11-23 LAB — CBC WITH DIFFERENTIAL/PLATELET
BASOS PCT: 1 %
Basophils Absolute: 0.1 10*3/uL (ref 0–0.1)
EOS ABS: 0.3 10*3/uL (ref 0–0.7)
Eosinophils Relative: 3 %
HEMATOCRIT: 37.6 % (ref 35.0–47.0)
HEMOGLOBIN: 12 g/dL (ref 12.0–16.0)
LYMPHS ABS: 1.4 10*3/uL (ref 1.0–3.6)
Lymphocytes Relative: 11 %
MCH: 22.6 pg — AB (ref 26.0–34.0)
MCHC: 31.9 g/dL — ABNORMAL LOW (ref 32.0–36.0)
MCV: 70.8 fL — ABNORMAL LOW (ref 80.0–100.0)
MONOS PCT: 11 %
Monocytes Absolute: 1.4 10*3/uL — ABNORMAL HIGH (ref 0.2–0.9)
NEUTROS ABS: 9.5 10*3/uL — AB (ref 1.4–6.5)
NEUTROS PCT: 74 %
Platelets: 215 10*3/uL (ref 150–440)
RBC: 5.31 MIL/uL — ABNORMAL HIGH (ref 3.80–5.20)
RDW: 32.1 % — ABNORMAL HIGH (ref 11.5–14.5)
WBC: 12.7 10*3/uL — AB (ref 3.6–11.0)

## 2016-11-23 LAB — TROPONIN I: Troponin I: <0.03 ng/mL (ref <0.03)

## 2016-11-23 LAB — VALPROIC ACID LEVEL: VALPROIC ACID LVL: 26 ug/mL — AB (ref 50.0–100.0)

## 2016-11-23 LAB — LACTIC ACID, PLASMA: Lactic Acid, Venous: 1.1 mmol/L (ref 0.5–1.9)

## 2016-11-23 MED ORDER — FUROSEMIDE 40 MG PO TABS: 20.0000 mg | ORAL_TABLET | Freq: Every day | ORAL | Status: DC

## 2016-11-23 MED ORDER — ENOXAPARIN SODIUM 40 MG/0.4ML ~~LOC~~ SOLN
40.0000 mg | SUBCUTANEOUS | Status: DC
  Administered 2016-11-24 – 2016-11-27 (×5): 40 mg via SUBCUTANEOUS
  Filled 2016-11-23 (×5): qty 0.4

## 2016-11-23 MED ORDER — ATORVASTATIN CALCIUM 20 MG PO TABS
20.0000 mg | ORAL_TABLET | Freq: Every day | ORAL | Status: DC
  Administered 2016-11-24 – 2016-11-27 (×5): 20 mg via ORAL
  Filled 2016-11-23 (×6): qty 1

## 2016-11-23 MED ORDER — ARFORMOTEROL TARTRATE 15 MCG/2ML IN NEBU
15.0000 ug | INHALATION_SOLUTION | Freq: Two times a day (BID) | RESPIRATORY_TRACT | Status: DC
  Administered 2016-11-24 – 2016-11-28 (×9): 15 ug via RESPIRATORY_TRACT
  Filled 2016-11-23 (×11): qty 2

## 2016-11-23 MED ORDER — MONTELUKAST SODIUM 10 MG PO TABS
10.0000 mg | ORAL_TABLET | Freq: Every morning | ORAL | Status: DC
  Administered 2016-11-24 – 2016-11-28 (×5): 10 mg via ORAL
  Filled 2016-11-23 (×5): qty 1

## 2016-11-23 MED ORDER — SERTRALINE HCL 50 MG PO TABS
100.0000 mg | ORAL_TABLET | Freq: Every day | ORAL | Status: DC
  Administered 2016-11-24 – 2016-11-27 (×5): 100 mg via ORAL
  Filled 2016-11-23 (×5): qty 2

## 2016-11-23 MED ORDER — SODIUM CHLORIDE 0.9 % IV BOLUS (SEPSIS)
1000.0000 mL | Freq: Once | INTRAVENOUS | Status: AC
  Administered 2016-11-23: 1000 mL via INTRAVENOUS

## 2016-11-23 MED ORDER — BUDESONIDE 0.5 MG/2ML IN SUSP
0.5000 mg | Freq: Two times a day (BID) | RESPIRATORY_TRACT | Status: DC
  Administered 2016-11-24 – 2016-11-28 (×9): 0.5 mg via RESPIRATORY_TRACT
  Filled 2016-11-23 (×9): qty 2

## 2016-11-23 MED ORDER — BUPROPION HCL ER (XL) 150 MG PO TB24
150.0000 mg | ORAL_TABLET | Freq: Every day | ORAL | Status: DC
  Administered 2016-11-24 – 2016-11-28 (×5): 150 mg via ORAL
  Filled 2016-11-23 (×5): qty 1

## 2016-11-23 MED ORDER — FUROSEMIDE 10 MG/ML IJ SOLN
20.0000 mg | Freq: Every day | INTRAMUSCULAR | Status: DC
  Administered 2016-11-24 – 2016-11-28 (×5): 20 mg via INTRAVENOUS
  Filled 2016-11-23 (×5): qty 2

## 2016-11-23 MED ORDER — DIVALPROEX SODIUM 250 MG PO DR TAB
250.0000 mg | DELAYED_RELEASE_TABLET | Freq: Two times a day (BID) | ORAL | Status: DC
  Administered 2016-11-24 – 2016-11-28 (×10): 250 mg via ORAL
  Filled 2016-11-23 (×12): qty 1

## 2016-11-23 MED ORDER — MEMANTINE HCL 5 MG PO TABS
10.0000 mg | ORAL_TABLET | Freq: Two times a day (BID) | ORAL | Status: DC
  Administered 2016-11-24 – 2016-11-28 (×10): 10 mg via ORAL
  Filled 2016-11-23 (×10): qty 2

## 2016-11-23 MED ORDER — NORTRIPTYLINE HCL 10 MG PO CAPS
20.0000 mg | ORAL_CAPSULE | Freq: Every day | ORAL | Status: DC
  Administered 2016-11-24 – 2016-11-27 (×5): 20 mg via ORAL
  Filled 2016-11-23 (×6): qty 2

## 2016-11-23 MED ORDER — IPRATROPIUM-ALBUTEROL 0.5-2.5 (3) MG/3ML IN SOLN
3.0000 mL | Freq: Once | RESPIRATORY_TRACT | Status: AC
  Administered 2016-11-23: 3 mL via RESPIRATORY_TRACT
  Filled 2016-11-23: qty 3

## 2016-11-23 MED ORDER — DEXTROSE 5 % IV SOLN
1.0000 g | Freq: Once | INTRAVENOUS | Status: AC
  Administered 2016-11-23: 1 g via INTRAVENOUS
  Filled 2016-11-23: qty 1

## 2016-11-23 MED ORDER — ALPRAZOLAM 0.5 MG PO TABS
0.2500 mg | ORAL_TABLET | Freq: Two times a day (BID) | ORAL | Status: DC
  Administered 2016-11-24 – 2016-11-28 (×9): 0.25 mg via ORAL
  Filled 2016-11-23 (×10): qty 1

## 2016-11-23 MED ORDER — RISPERIDONE 0.5 MG PO TABS
0.5000 mg | ORAL_TABLET | Freq: Two times a day (BID) | ORAL | Status: DC
  Administered 2016-11-24 – 2016-11-28 (×10): 0.5 mg via ORAL
  Filled 2016-11-23 (×11): qty 1

## 2016-11-23 MED ORDER — FERROUS SULFATE 325 (65 FE) MG PO TABS
325.0000 mg | ORAL_TABLET | Freq: Every day | ORAL | Status: DC
  Administered 2016-11-24 – 2016-11-28 (×5): 325 mg via ORAL
  Filled 2016-11-23 (×5): qty 1

## 2016-11-23 MED ORDER — SACCHAROMYCES BOULARDII 250 MG PO CAPS
250.0000 mg | ORAL_CAPSULE | Freq: Two times a day (BID) | ORAL | Status: DC
  Administered 2016-11-24 – 2016-11-28 (×10): 250 mg via ORAL
  Filled 2016-11-23 (×10): qty 1

## 2016-11-23 MED ORDER — PREDNISONE 10 MG PO TABS
5.0000 mg | ORAL_TABLET | Freq: Every day | ORAL | Status: DC
  Administered 2016-11-24 – 2016-11-28 (×5): 5 mg via ORAL
  Filled 2016-11-23 (×5): qty 1

## 2016-11-23 MED ORDER — DONEPEZIL HCL 10 MG PO TABS
10.0000 mg | ORAL_TABLET | Freq: Every morning | ORAL | Status: DC
  Administered 2016-11-24 – 2016-11-28 (×5): 10 mg via ORAL
  Filled 2016-11-23 (×5): qty 1

## 2016-11-23 MED ORDER — FLUTICASONE PROPIONATE 50 MCG/ACT NA SUSP: 2.0000 | Freq: Every day | NASAL | Status: DC

## 2016-11-23 MED ORDER — PANTOPRAZOLE SODIUM 40 MG PO TBEC
40.0000 mg | DELAYED_RELEASE_TABLET | Freq: Every day | ORAL | Status: DC
  Administered 2016-11-24 – 2016-11-28 (×5): 40 mg via ORAL
  Filled 2016-11-23 (×5): qty 1

## 2016-11-23 MED ORDER — LOPERAMIDE HCL 2 MG PO CAPS: 2.0000 mg | ORAL_CAPSULE | ORAL | Status: DC | PRN

## 2016-11-23 MED ORDER — VANCOMYCIN HCL IN DEXTROSE 1-5 GM/200ML-% IV SOLN
1000.0000 mg | Freq: Once | INTRAVENOUS | Status: AC
  Administered 2016-11-23: 1000 mg via INTRAVENOUS
  Filled 2016-11-23: qty 200

## 2016-11-23 MED ORDER — VANCOMYCIN HCL 10 G IV SOLR
1250.0000 mg | INTRAVENOUS | Status: DC
  Administered 2016-11-24 – 2016-11-26 (×4): 1250 mg via INTRAVENOUS
  Filled 2016-11-23 (×6): qty 1250

## 2016-11-23 MED ORDER — ASPIRIN EC 81 MG PO TBEC
81.0000 mg | DELAYED_RELEASE_TABLET | Freq: Every day | ORAL | Status: DC
  Administered 2016-11-24 – 2016-11-28 (×5): 81 mg via ORAL
  Filled 2016-11-23 (×5): qty 1

## 2016-11-23 MED ORDER — DEXTROSE 5 % IV SOLN
1.0000 g | Freq: Three times a day (TID) | INTRAVENOUS | Status: DC
  Administered 2016-11-24 – 2016-11-28 (×14): 1 g via INTRAVENOUS
  Filled 2016-11-23 (×17): qty 1

## 2016-11-23 NOTE — ED Notes (Addendum)
Pt's IV site appears less swollen and red at this time, family also states this as well. IV flushes without difficulty at this time. Will continue to monitor. No further orders from EDP at this time.

## 2016-11-23 NOTE — ED Notes (Signed)
Right hand IV site appears red with some swelling, EDP notified and is in room, per EDP stop Vanc. IV flushes without difficulty or distress to pt. No new orders from EDP at this time. Will continue to monitor.

## 2016-11-23 NOTE — ED Notes (Signed)
Admitting Dr. Sherryll BurgerShah notified of pt not being able to tolerate PO medications due to hx dementia and chewing tablets. Dr states he will check the orders and fix for follow up.

## 2016-11-23 NOTE — Progress Notes (Signed)
Pharmacy Antibiotic Note  Rock NephewLinda Gailey is a 71 y.o. female admitted on 11/23/2016 with PNA.  Pharmacy has been consulted for vancomycin dosing.  Plan: Vancomycin 1 gm IV x 1 in ED followed in approximately 9 hours (stacked dosing) by vancomycin 1.25 gm IV Q18H, predicted trough 17 mcg/mL. Pharmacy will continue to follow and adjust as needed to maintain trough 15 to 20 mcg/mL.   Vd 42.8 L, Ke 0.056 hr-1, T1/2 12.4 hr  Height: 5\' 7"  (170.2 cm) Weight: 156 lb (70.8 kg) IBW/kg (Calculated) : 61.6  Temp (24hrs), Avg:100.5 F (38.1 C), Min:100.4 F (38 C), Max:100.6 F (38.1 C)   Recent Labs Lab 11/23/16 1758  WBC 12.7*  CREATININE 0.61  LATICACIDVEN 1.1    Estimated Creatinine Clearance: 62.7 mL/min (by C-G formula based on SCr of 0.61 mg/dL).    Allergies  Allergen Reactions  . Erythromycin Hives  . Sulfa Antibiotics Hives    Thank you for allowing pharmacy to be a part of this patient's care.  Carola FrostNathan A Morgon Pamer, Pharm.D., BCPS Clinical Pharmacist 11/23/2016 7:36 PM

## 2016-11-23 NOTE — H&P (Signed)
[facility] - [place] at [facility]   PATIENT NAME: Jessica NephewLinda Webster    MR#:  [id number]  DATE OF BIRTH:  1946-02-27  DATE OF ADMISSION:  11/23/2016  PRIMARY CARE PHYSICIAN: Clovis PuVan Horn, Arturo MortonJill E, DO    REQUESTING/REFERRING PHYSICIAN: Jeanmarie PlantMcShane, James A, MD  CHIEF COMPLAINT:   Chief Complaint  Patient presents with  . Fever  . Hypertension  . Altered Mental Status   HISTORY OF PRESENT ILLNESS:  Jessica NephewLinda Webster  is a 71 y.o. female with a known history of Asthma, GERD, Alzheimer's dementia, wheelchair bound presents to the emergency room from [facility].   History obtained from reviewing old records, ER staff and family at bedside.  As per family she's been getting weaker since yesterday. Not focally but with requirement of assistance for eating, etc., today she is more somnolent than normal. She has had a cough she does have chronic asthma and she is on chronic steroids. this afternoon she was found to have a fever and was brought in for further evaluation and management.  In the ED she was noted to have possible pneumonia on chest x-ray. PAST MEDICAL HISTORY:   Past Medical History:  Diagnosis Date  . Alzheimer disease   . Anxiety   . Asthma   . Hypertension    PAST SURGICAL HISTORY:   Past Surgical History:  Procedure Laterality Date  . APPENDECTOMY    . NASAL SINUS SURGERY      SOCIAL HISTORY:   Social History  Substance Use Topics  . Smoking status: Former Games developermoker  . Smokeless tobacco: Former NeurosurgeonUser  . Alcohol use No   FAMILY HISTORY:   Family History  Problem Relation Age of Onset  . Asthma Father   . Brain cancer Brother     DRUG ALLERGIES:   Allergies  Allergen Reactions  . Erythromycin Hives  . Sulfa Antibiotics Hives   REVIEW OF SYSTEMS:   Review of Systems  Unable to perform ROS: Dementia   MEDICATIONS AT HOME:   Prior to Admission medications   Medication Sig Start Date End Date Taking? Authorizing Provider    ALPRAZolam (XANAX) 0.25 MG tablet Take 1 tablet (0.25 mg total) by mouth 2 (two) times daily. 11/04/16  Yes Wieting, Richard, MD  arformoterol (BROVANA) 15 MCG/2ML NEBU Take 2 mLs (15 mcg total) by nebulization 2 (two) times daily. 08/02/16  Yes Merwyn KatosSimonds, David B, MD  aspirin EC 81 MG tablet Take 81 mg by mouth daily.   Yes [provider]  atorvastatin (LIPITOR) 20 MG tablet Take 20 mg by mouth at bedtime.   Yes [provider]  budesonide (PULMICORT) 0.5 MG/2ML nebulizer solution Take 2 mLs (0.5 mg total) by nebulization 2 (two) times daily. 08/02/16 08/02/17 Yes Merwyn KatosSimonds, David B, MD  buPROPion (WELLBUTRIN XL) 150 MG 24 hr tablet Take 150 mg by mouth daily. 11/09/16  Yes [provider]  divalproex (DEPAKOTE) 250 MG DR tablet Take 1 tablet (250 mg total) by mouth every 12 (twelve) hours. 11/04/16  Yes Wieting, Richard, MD  donepezil (ARICEPT) 10 MG tablet Take 10 mg by mouth every morning.    Yes [provider]  ferrous sulfate 325 (65 FE) MG tablet Take 325 mg by mouth daily with breakfast.   Yes [provider]  fluticasone (FLONASE) 50 MCG/ACT nasal spray Place 2 sprays into both nostrils daily.   Yes [provider]  furosemide (LASIX) 20 MG tablet Take 20 mg by mouth.  Yes [provider]  ipratropium-albuterol (DUONEB) 0.5-2.5 (3) MG/3ML SOLN Inhale 3 mLs into the lungs every 4 (four) hours as needed. 09/20/16 09/15/17 Yes Merwyn KatosSimonds, David B, MD  loperamide (IMODIUM) 2 MG capsule Take 2 mg by mouth as needed for diarrhea or loose stools.   Yes [provider]  memantine (NAMENDA) 10 MG tablet Take 10 mg by mouth 2 (two) times daily.   Yes [provider]  montelukast (SINGULAIR) 10 MG tablet Take 10 mg by mouth every morning.    Yes [provider]  nortriptyline (PAMELOR) 10 MG capsule Take 20 mg by mouth at bedtime.    Yes [provider]  omeprazole (PRILOSEC) 20 MG capsule Take 20 mg by mouth  daily.   Yes [provider]  polyethylene glycol (MIRALAX / GLYCOLAX) packet Take 17 g by mouth daily as needed.    Yes [provider]  predniSONE (DELTASONE) 5 MG tablet Take 1 tablet (5 mg total) by mouth daily with breakfast. 10/20/16  Yes Merwyn KatosSimonds, David B, MD  risperiDONE (RISPERDAL) 0.5 MG tablet Take 0.5 mg by mouth 2 (two) times daily.   Yes [provider]  saccharomyces boulardii (FLORASTOR) 250 MG capsule Take 250 mg by mouth 2 (two) times daily.   Yes [provider]  sertraline (ZOLOFT) 100 MG tablet Take 100 mg by mouth at bedtime.    Yes [provider]   VITAL SIGNS:  Blood pressure (!) 144/79, pulse 85, temperature (!) 100.4 F (38 C), temperature source Rectal, resp. rate (!) 24, height 5\' 7"  (1.702 m), weight 70.8 kg (156 lb), SpO2 94 %. PHYSICAL EXAMINATION:  Physical Exam GENERAL:  71 y.o.-year-old patient lying in the bed with no acute distress.  EYES: Pupils equal, round, reactive to light and accommodation. No scleral icterus. Extraocular muscles intact.  HEENT: Head atraumatic, normocephalic. Oropharynx and nasopharynx clear.  NECK:  Supple, no jugular venous distention. No thyroid enlargement, no tenderness.  LUNGS: Normal breath sounds bilaterally, no wheezing, rales,rhonchi or crepitation. No use of accessory muscles of respiration.  CARDIOVASCULAR: S1, S2 normal. No murmurs, rubs, or gallops.  ABDOMEN: Soft, nontender, nondistended. Bowel sounds present. No organomegaly or mass.  EXTREMITIES: No pedal edema, cyanosis, or clubbing.  NEUROLOGIC: Cranial nerves II through XII are intact. Muscle strength 5/5 in all extremities. Difficult exam as she wouldn't cooperate. Sensation intact. Gait not checked.  PSYCHIATRIC: The patient is Obtunded SKIN: No obvious rash, lesion, or ulcer.  LABORATORY PANEL:   CBC  Recent Labs Lab 11/23/16 1758  WBC 12.7*  HGB 12.0  HCT 37.6  PLT 215    ------------------------------------------------------------------------------------------------------------------  Chemistries   Recent Labs Lab 11/23/16 1758  NA 138  K 3.5  CL 103  CO2 25  GLUCOSE 109*  BUN 14  CREATININE 0.61  CALCIUM 9.1  AST 20  ALT 17  ALKPHOS 100  BILITOT 0.6   ------------------------------------------------------------------------------------------------------------------  Cardiac Enzymes  Recent Labs Lab 11/23/16 1758  TROPONINI <0.03   ------------------------------------------------------------------------------------------------------------------  RADIOLOGY:  Dg Chest Port 1 View  Result Date: 11/23/2016 CLINICAL DATA:  Fever, altered mental status EXAM: PORTABLE CHEST 1 VIEW COMPARISON:  08/26/2016 FINDINGS: Mild right lower lobe opacity, pneumonia not excluded. Left lung is clear. No pleural effusion or pneumothorax. The heart is normal in size. IMPRESSION: Mild right lower lobe opacity, pneumonia not excluded. Electronically Signed   By: Charline BillsSriyesh  Krishnan M.D.   On: 11/23/2016 19:06   IMPRESSION AND PLAN:  71 y.o. female with a known  history of Asthma, GERD, Alzheimer's dementia, wheelchair bound presents to the emergency room from [facility]. She is being admitted for altered mental status  * Acute metabolic encephalopathy - Likely due to infection.  Underlying severe dementia - We will check valproic acid - Consult neurology - She may become agitated and require sitter  * Possible pneumonia: Seen on chest x-ray, also had low-grade fever, and leukocytosis - Hospital/healthcare acquired as she leaves at [facility] - Cover with empiric antibiotics - We will obtain blood and sputum culture - Check pro calcitonin, if it is negative, antibiotics can be stopped  * Dementia with behavioral disturbance and anxiety. Depakote should also help out with mood stabilization. Continue Zoloft, nortriptyline  and Risperidal.   * Asthma. Respiratory status stable on nebulizer treatments. On chronic prednisone for chronic bronchitis  * Hyperlipidemia unspecified on atorvastatin  * Essential hypertension - Continue Lasix and monitor blood pressure    All the records are reviewed and case discussed with ED provider. Management plans discussed with the patient, family (daughter and husband at bedside) and they are in agreement.  CODE STATUS: DO NOT RESUSCITATE  TOTAL TIME TAKING CARE OF THIS PATIENT: 45 minutes.    Delfino Lovett M.D on 11/23/2016 at 9:18 PM  Between 7am to 6pm - Pager - [phone]  After 6pm go to [email or web address] - password [email or web address]  [facility] [place] [facility]  Office  [phone]  CC: Primary care physician; Clovis Pu, Arturo Morton, DO   Note: This dictation was prepared with Dragon dictation along with smaller phrase technology. Any transcriptional errors that result from this process are unintentional.

## 2016-11-23 NOTE — Progress Notes (Signed)
Family Meeting Note  Advance Directive:yes  Today a meeting took place with the spouse and Daughter.  Patient is unable to participate due AV:[id number]:Lacked capacity Obtunded   The following clinical team members were present during this meeting:MD  The following were discussed:Patient's diagnosis: , Patient's progosis: > 12 months and Goals for treatment: DNR  Additional follow-up to be provided: Palliative care c/s - may benefit from palliative care follow-up.  While at the facility  Time spent during discussion:20 minutes  Delfino LovettVipul Keval Nam, MD

## 2016-11-23 NOTE — ED Notes (Signed)
Pt able to drink water from cup and swallow without difficulty at this time.

## 2016-11-23 NOTE — ED Triage Notes (Signed)
Pt brought in by husband from [place] [facility] via wheelchair.  Pt has a fever, altered mental status.   Face flushed.  Pt has dementia. Family reports more confused than usual.

## 2016-11-23 NOTE — ED Notes (Signed)
Patient right hand is hurting her and is red. Stopped vanc, edp notified.

## 2016-11-23 NOTE — ED Provider Notes (Signed)
[facility] Emergency Department Provider Note  ____________________________________________   I have reviewed the triage vital signs and the nursing notes.   HISTORY  Chief Complaint Fever; Hypertension; and Altered Mental Status    HPI [name] is a 71 y.o. female  who suffers from significant dementia is in the memory ward, family states that she's been getting weaker since yesterday. Not focally but with requirement of assistance for eating, etc., today she is more somnolent than normal. She has had a cough she does have chronic asthma and she is on chronic steroids. His afternoon she was found to have a fever and brought in. Patient herself cannot give a history. ComLevel 5 chart caveat; no further history available due to patient status.   Past Medical History:  Diagnosis Date  . Alzheimer disease   . Anxiety   . Asthma   . Hypertension     Patient Active Problem List   Diagnosis Date Noted  . Seizure (HCC) 11/01/2016    Past Surgical History:  Procedure Laterality Date  . APPENDECTOMY    . NASAL SINUS SURGERY      Prior to Admission medications   Medication Sig Start Date End Date Taking? Authorizing Provider  ALPRAZolam (XANAX) 0.25 MG tablet Take 1 tablet (0.25 mg total) by mouth 2 (two) times daily. 11/04/16   Alford HighlandWieting, Richard, MD  arformoterol (BROVANA) 15 MCG/2ML NEBU Take 2 mLs (15 mcg total) by nebulization 2 (two) times daily. 08/02/16   Merwyn KatosSimonds, David B, MD  aspirin EC 81 MG tablet Take 81 mg by mouth daily.    [provider]  atorvastatin (LIPITOR) 20 MG tablet Take 20 mg by mouth at bedtime.    [provider]  budesonide (PULMICORT) 0.5 MG/2ML nebulizer solution Take 2 mLs (0.5 mg total) by nebulization 2 (two) times daily. 08/02/16 08/02/17  Merwyn KatosSimonds, David B, MD  divalproex (DEPAKOTE) 250 MG DR tablet Take 1 tablet (250 mg total) by mouth every 12 (twelve) hours. 11/04/16   Alford HighlandWieting, Richard, MD  donepezil  (ARICEPT) 10 MG tablet Take 10 mg by mouth every morning.     [provider]  ferrous sulfate 325 (65 FE) MG tablet Take 325 mg by mouth daily with breakfast.    [provider]  fluticasone (FLONASE) 50 MCG/ACT nasal spray Place 2 sprays into both nostrils daily.    [provider]  furosemide (LASIX) 20 MG tablet Take 20 mg by mouth.    [provider]  ipratropium-albuterol (DUONEB) 0.5-2.5 (3) MG/3ML SOLN Inhale 3 mLs into the lungs every 4 (four) hours as needed. 09/20/16 09/15/17  Merwyn KatosSimonds, David B, MD  loperamide (IMODIUM) 2 MG capsule Take 2 mg by mouth as needed for diarrhea or loose stools.    [provider]  memantine (NAMENDA) 10 MG tablet Take 10 mg by mouth 2 (two) times daily.    [provider]  montelukast (SINGULAIR) 10 MG tablet Take 10 mg by mouth every morning.     [provider]  nortriptyline (PAMELOR) 10 MG capsule Take 20 mg by mouth at bedtime.     [provider]  omeprazole (PRILOSEC) 20 MG capsule Take 20 mg by mouth daily.    [provider]  polyethylene glycol (MIRALAX / GLYCOLAX) packet Take 17 g by mouth daily as needed.     [provider]  predniSONE (DELTASONE) 5 MG tablet Take 1 tablet (5 mg total) by mouth daily with breakfast. 10/20/16   Simonds,  Oley Balm, MD  risperiDONE (RISPERDAL) 0.5 MG tablet Take 0.5 mg by mouth 2 (two) times daily.    [provider]  saccharomyces boulardii (FLORASTOR) 250 MG capsule Take 250 mg by mouth 2 (two) times daily.    [provider]  sertraline (ZOLOFT) 100 MG tablet Take 100 mg by mouth at bedtime.     [provider]    Allergies Erythromycin and Sulfa antibiotics  Family History  Problem Relation Age of Onset  . Asthma Father   . Brain cancer Brother     Social History Social History  Substance Use Topics  . Smoking status: Former Games developer  . Smokeless tobacco: Former Neurosurgeon  . Alcohol use No     Review of Systems Level 5 chart caveat; no further history available due to patient status.   ____________________________________________   PHYSICAL EXAM:  VITAL SIGNS: ED Triage Vitals  Enc Vitals Group     BP 11/23/16 1753 (!) 157/84     Pulse Rate 11/23/16 1753 (!) 108     Resp 11/23/16 1753 20     Temp 11/23/16 1753 (!) 100.6 F (38.1 C)     Temp Source 11/23/16 1753 Oral     SpO2 11/23/16 1753 92 %     Weight 11/23/16 1750 156 lb (70.8 kg)     Height 11/23/16 1756 5\' 7"  (1.702 m)     Head Circumference --      Peak Flow --      Pain Score --      Pain Loc --      Pain Edu? --      Excl. in GC? --     Constitutional:Somnolent but guarding her airway, arousable Eyes: Conjunctivae are normal Head: Atraumatic HEENT: No congestion/rhinnorhea. Mucous membranes are dry.  Oropharynx non-erythematous Neck:   Nontender with no meningismus, no masses, no stridor Cardiovascular: Normal rate, regular rhythm. Grossly normal heart sounds.  Good peripheral circulation. Respiratory: Diminished in the bases on the right there are occasional rhonchi Abdominal: Soft and nontender. No distention. No guarding no rebound Back:  There is no focal tenderness or step off.  there is no midline tenderness there are no lesions noted. there is no CVA tenderness Musculoskeletal: No lower extremity tenderness, no upper extremity tenderness. No joint effusions, no DVT signs strong distal pulses no edema Neurologic:  Normal speech and language. No gross focal neurologic deficits are appreciated.  Skin:  Skin is warm, dry and intact. No rash noted.   ____________________________________________   LABS (all labs ordered are listed, but only abnormal results are displayed)  Labs Reviewed  COMPREHENSIVE METABOLIC PANEL - Abnormal; Notable for the following:       Result Value   Glucose, Bld 109 (*)    All other components within normal limits  CBC WITH DIFFERENTIAL/PLATELET - Abnormal;  Notable for the following:    WBC 12.7 (*)    RBC 5.31 (*)    MCV 70.8 (*)    MCH 22.6 (*)    MCHC 31.9 (*)    RDW 32.1 (*)    Neutro Abs 9.5 (*)    Monocytes Absolute 1.4 (*)    All other components within normal limits  URINALYSIS, ROUTINE W REFLEX MICROSCOPIC - Abnormal; Notable for the following:    Color, Urine YELLOW (*)    APPearance HAZY (*)    Protein, ur 30 (*)    Leukocytes, UA TRACE (*)    Squamous Epithelial / LPF 0-5 (*)  All other components within normal limits  CULTURE, BLOOD (ROUTINE X 2)  CULTURE, BLOOD (ROUTINE X 2)  URINE CULTURE  TROPONIN I  LACTIC ACID, PLASMA   ____________________________________________  EKG  I personally interpreted any EKGs ordered by me or triage Normal sinus rhythm, mild tachycardia noted 101 no ST elevation no ST depression nonspecific changes, ____________________________________________  RADIOLOGY  I reviewed any imaging ordered by me or triage that were performed during my shift and, if possible, patient and/or family made aware of any abnormal findings. ____________________________________________   PROCEDURES  Procedure(s) performed: None  Procedures  Critical Care performed: None  ____________________________________________   INITIAL IMPRESSION / ASSESSMENT AND PLAN / ED COURSE  Pertinent labs & imaging results that were available during my care of the patient were reviewed by me and considered in my medical decision making (see chart for details).  Patient here with increased weakness over baseline, fever, cough. Chest x-ray shows a pneumonia. Patient is off of her baseline according to family with increased generalized weakness. We will give her hospital-acquired pneumonia antibiotics. Given altered mental status and fever, I feel that she would benefit from an observational stay in the hospital.    ____________________________________________   FINAL CLINICAL IMPRESSION(S) / ED DIAGNOSES  Final  diagnoses:  Fever      This chart was dictated using voice recognition software.  Despite best efforts to proofread,  errors can occur which can change meaning.      Jeanmarie PlantMcShane, James A, MD 11/23/16 2006

## 2016-11-24 DIAGNOSIS — G9341 Metabolic encephalopathy: Secondary | ICD-10-CM

## 2016-11-24 MED ORDER — IPRATROPIUM-ALBUTEROL 0.5-2.5 (3) MG/3ML IN SOLN
3.0000 mL | Freq: Four times a day (QID) | RESPIRATORY_TRACT | Status: DC
  Administered 2016-11-24 (×3): 3 mL via RESPIRATORY_TRACT
  Filled 2016-11-24 (×3): qty 3

## 2016-11-24 MED ORDER — ACETAMINOPHEN 325 MG PO TABS
650.0000 mg | ORAL_TABLET | Freq: Four times a day (QID) | ORAL | Status: DC | PRN
  Administered 2016-11-24: 05:00:00 650 mg via ORAL
  Filled 2016-11-24: qty 2

## 2016-11-24 NOTE — Care Management (Signed)
Patient admitted from [facility].  CSW notified. Palliative consult pending.

## 2016-11-24 NOTE — Consult Note (Signed)
Reason for Consult:AMS Referring Physician: Elisabeth PigeonVachhani  CC: AMS  HPI: Rock NephewLinda Kulick is an 71 y.o. female with a history of Alzheimer's dementia (wheelchair bound) who is unable to provide any history due to mental status.  Family not available therefore all history obtained from the chart.  Per family she's been getting weaker since 7/31. Not focally but with requirement of assistance for eating, etc.  Yesterday she was more somnolent than normal. She has had a cough.  Also yesterday afternoon she was found to have a fever and was brought in for further evaluation and management.   Past Medical History:  Diagnosis Date  . Alzheimer disease   . Anxiety   . Asthma   . Hypertension     Past Surgical History:  Procedure Laterality Date  . APPENDECTOMY    . NASAL SINUS SURGERY      Family History  Problem Relation Age of Onset  . Asthma Father   . Brain cancer Brother     Social History:  reports that she has quit smoking. She has quit using smokeless tobacco. She reports that she does not drink alcohol or use drugs.  Allergies  Allergen Reactions  . Erythromycin Hives  . Sulfa Antibiotics Hives  . Vancomycin Rash    Noted redness at IV site during infusion.     Medications:  I have reviewed the patient's current medications. Prior to Admission:  Prescriptions Prior to Admission  Medication Sig Dispense Refill Last Dose  . ALPRAZolam (XANAX) 0.25 MG tablet Take 1 tablet (0.25 mg total) by mouth 2 (two) times daily. 60 tablet 0 11/23/2016 at am  . arformoterol (BROVANA) 15 MCG/2ML NEBU Take 2 mLs (15 mcg total) by nebulization 2 (two) times daily. 120 mL 6 11/23/2016 at am  . aspirin EC 81 MG tablet Take 81 mg by mouth daily.   11/23/2016 at am  . atorvastatin (LIPITOR) 20 MG tablet Take 20 mg by mouth at bedtime.   11/22/2016 at qhs  . budesonide (PULMICORT) 0.5 MG/2ML nebulizer solution Take 2 mLs (0.5 mg total) by nebulization 2 (two) times daily. 120 mL 10 11/23/2016 at am  .  buPROPion (WELLBUTRIN XL) 150 MG 24 hr tablet Take 150 mg by mouth daily.   11/23/2016 at am  . divalproex (DEPAKOTE) 250 MG DR tablet Take 1 tablet (250 mg total) by mouth every 12 (twelve) hours. 60 tablet 0 11/23/2016 at am  . donepezil (ARICEPT) 10 MG tablet Take 10 mg by mouth every morning.    11/23/2016 at am  . ferrous sulfate 325 (65 FE) MG tablet Take 325 mg by mouth daily with breakfast.   11/23/2016 at am  . fluticasone (FLONASE) 50 MCG/ACT nasal spray Place 2 sprays into both nostrils daily.   11/23/2016 at am  . furosemide (LASIX) 20 MG tablet Take 20 mg by mouth.   11/23/2016 at am  . ipratropium-albuterol (DUONEB) 0.5-2.5 (3) MG/3ML SOLN Inhale 3 mLs into the lungs every 4 (four) hours as needed. 540 mL 11 prn at prn  . loperamide (IMODIUM) 2 MG capsule Take 2 mg by mouth as needed for diarrhea or loose stools.   prn at prn  . memantine (NAMENDA) 10 MG tablet Take 10 mg by mouth 2 (two) times daily.   11/23/2016 at am  . montelukast (SINGULAIR) 10 MG tablet Take 10 mg by mouth every morning.    11/23/2016 at am  . nortriptyline (PAMELOR) 10 MG capsule Take 20 mg by mouth at bedtime.  11/22/2016 at qhs  . omeprazole (PRILOSEC) 20 MG capsule Take 20 mg by mouth daily.   11/23/2016 at am  . polyethylene glycol (MIRALAX / GLYCOLAX) packet Take 17 g by mouth daily as needed.    prn at prn  . predniSONE (DELTASONE) 5 MG tablet Take 1 tablet (5 mg total) by mouth daily with breakfast. 30 tablet 5 11/23/2016 at am  . risperiDONE (RISPERDAL) 0.5 MG tablet Take 0.5 mg by mouth 2 (two) times daily.   11/23/2016 at am  . saccharomyces boulardii (FLORASTOR) 250 MG capsule Take 250 mg by mouth 2 (two) times daily.   11/23/2016 at am  . sertraline (ZOLOFT) 100 MG tablet Take 100 mg by mouth at bedtime.    11/22/2016 at qhs   Scheduled: . ALPRAZolam  0.25 mg Oral BID  . arformoterol  15 mcg Nebulization BID  . aspirin EC  81 mg Oral Daily  . atorvastatin  20 mg Oral QHS  . budesonide  0.5 mg Nebulization BID  .  buPROPion  150 mg Oral Daily  . divalproex  250 mg Oral Q12H  . donepezil  10 mg Oral q morning - 10a  . enoxaparin (LOVENOX) injection  40 mg Subcutaneous Q24H  . ferrous sulfate  325 mg Oral Q breakfast  . fluticasone  2 spray Each Nare Daily  . furosemide  20 mg Intravenous Daily  . ipratropium-albuterol  3 mL Nebulization Q6H  . memantine  10 mg Oral BID  . montelukast  10 mg Oral q morning - 10a  . nortriptyline  20 mg Oral QHS  . pantoprazole  40 mg Oral Daily  . predniSONE  5 mg Oral Q breakfast  . risperiDONE  0.5 mg Oral BID  . saccharomyces boulardii  250 mg Oral BID  . sertraline  100 mg Oral QHS    ROS: Unable to provide due to mental status  Physical Examination: Blood pressure 125/66, pulse 85, temperature 99.4 F (37.4 C), resp. rate 20, height 5\' 7"  (1.702 m), weight 68.9 kg (151 lb 14.4 oz), SpO2 94 %.  HEENT-  Normocephalic, no lesions, without obvious abnormality.  Normal external eye and conjunctiva.  Normal TM's bilaterally.  Normal auditory canals and external ears. Normal external nose, mucus membranes and septum.  Normal pharynx. Cardiovascular- S1, S2 normal, pulses palpable throughout   Lungs- chest clear, no wheezing, rales, normal symmetric air entry Abdomen- soft, non-tender; bowel sounds normal; no masses,  no organomegaly Extremities- no edema Lymph-no adenopathy palpable Musculoskeletal-no joint tenderness, deformity or swelling Skin-warm and dry, no hyperpigmentation, vitiligo, or suspicious lesions  Neurological Examination   Mental Status: Lethargic.  Opens eyes with deep sternal rub.  Says one- two words with painful stimuli.  Does not follow commands.   Cranial Nerves: II: Discs flat bilaterally; Blinks to bilateral confrontation, pupils equal, round, reactive to light and accommodation III,IV, VI: ptosis not present, extra-ocular motions intact bilaterally V,VII: grimaces symmetrically VIII: unable to test IX,X: gag reflex reduced XI:  unable to test XII: unable to test Motor: Moves all extremities spontaneously.  No focal weakness noted.   Sensory: Responds to noxious stimuli throughout Deep Tendon Reflexes: 2+ and symmetric with absent AJ's bilaterally Plantars: Right: mute   Left: mute Cerebellar: Unable to perform Gait: not tested due to safety concerns    Laboratory Studies:   Basic Metabolic Panel:  Recent Labs Lab 11/23/16 1758  NA 138  K 3.5  CL 103  CO2 25  GLUCOSE 109*  BUN  14  CREATININE 0.61  CALCIUM 9.1    Liver Function Tests:  Recent Labs Lab 11/23/16 1758  AST 20  ALT 17  ALKPHOS 100  BILITOT 0.6  PROT 7.5  ALBUMIN 3.6   No results for input(s): LIPASE, AMYLASE in the last 168 hours. No results for input(s): AMMONIA in the last 168 hours.  CBC:  Recent Labs Lab 11/23/16 1758  WBC 12.7*  NEUTROABS 9.5*  HGB 12.0  HCT 37.6  MCV 70.8*  PLT 215    Cardiac Enzymes:  Recent Labs Lab 11/23/16 1758  TROPONINI <0.03    BNP: Invalid input(s): POCBNP  CBG: No results for input(s): GLUCAP in the last 168 hours.  Microbiology: Results for orders placed or performed during the hospital encounter of 11/23/16  Blood Culture (routine x 2)     Status: None (Preliminary result)   Collection Time: 11/23/16  5:58 PM  Result Value Ref Range Status   Specimen Description BLOOD BLOOD LEFT HAND  Final   Special Requests   Final    BOTTLES DRAWN AEROBIC AND ANAEROBIC Blood Culture adequate volume   Culture NO GROWTH < 12 HOURS  Final   Report Status PENDING  Incomplete  Blood Culture (routine x 2)     Status: None (Preliminary result)   Collection Time: 11/23/16  7:08 PM  Result Value Ref Range Status   Specimen Description BLOOD BLOOD RIGHT HAND  Final   Special Requests   Final    BOTTLES DRAWN AEROBIC AND ANAEROBIC Blood Culture adequate volume   Culture NO GROWTH < 12 HOURS  Final   Report Status PENDING  Incomplete    Coagulation Studies: No results for  input(s): LABPROT, INR in the last 72 hours.  Urinalysis:  Recent Labs Lab 11/23/16 1758  COLORURINE YELLOW*  LABSPEC 1.026  PHURINE 6.0  GLUCOSEU NEGATIVE  HGBUR NEGATIVE  BILIRUBINUR NEGATIVE  KETONESUR NEGATIVE  PROTEINUR 30*  NITRITE NEGATIVE  LEUKOCYTESUR TRACE*    Lipid Panel:  No results found for: CHOL, TRIG, HDL, CHOLHDL, VLDL, LDLCALC  HgbA1C: No results found for: HGBA1C  Urine Drug Screen:     Component Value Date/Time   LABOPIA NONE DETECTED 05/05/2016 1749   COCAINSCRNUR NONE DETECTED 05/05/2016 1749   LABBENZ NONE DETECTED 05/05/2016 1749   AMPHETMU NONE DETECTED 05/05/2016 1749   THCU NONE DETECTED 05/05/2016 1749   LABBARB NONE DETECTED 05/05/2016 1749    Alcohol Level: No results for input(s): ETH in the last 168 hours.  Other results: EKG: sinus tachycardia at 101 bpm.  Imaging: Dg Chest Port 1 View  Result Date: 11/23/2016 CLINICAL DATA:  Fever, altered mental status EXAM: PORTABLE CHEST 1 VIEW COMPARISON:  08/26/2016 FINDINGS: Mild right lower lobe opacity, pneumonia not excluded. Left lung is clear. No pleural effusion or pneumothorax. The heart is normal in size. IMPRESSION: Mild right lower lobe opacity, pneumonia not excluded. Electronically Signed   By: Charline BillsSriyesh  Krishnan M.D.   On: 11/23/2016 19:06     Assessment/Plan: 71 year old female with altered mental status and fever.  Chest x-ray shows PNA.  In patient with underlying Alzheimer's, suspect decrease in mental status related to infection (metabolic encephalopathy).  Patient has been started on antibiotics.  Would expect mental status to improve as infection treated.  Improvement may lag behind clearance of infiltrate.    Recommendations: 1.  Agree with current management.  No further imaging indicated at this time  Thana FarrLeslie Nichol Ator, MD Neurology [phone] 11/24/2016, 6:47 PM

## 2016-11-24 NOTE — Progress Notes (Signed)
[employer] [facility] [place] [facility]   PATIENT NAME: Jessica NephewLinda Webster    MR#:  [id number]  DATE OF BIRTH:  04-15-1946  SUBJECTIVE:  CHIEF COMPLAINT:   Chief Complaint  Patient presents with  . Fever  . Hypertension  . Altered Mental Status  alzheimer's, lives in memory care- brought after having more confusion, not eating well. Found to have pneumonia.  REVIEW OF SYSTEMS:  Pt is drowsy, not giving ROS>  ROS  DRUG ALLERGIES:   Allergies  Allergen Reactions  . Erythromycin Hives  . Sulfa Antibiotics Hives  . Vancomycin Rash    Noted redness at IV site during infusion.     VITALS:  Blood pressure (!) 143/87, pulse 91, temperature 98.7 F (37.1 C), temperature source Oral, resp. rate 20, height 5\' 7"  (1.702 m), weight 68.9 kg (151 lb 14.4 oz), SpO2 93 %.  PHYSICAL EXAMINATION:  GENERAL:  71 y.o.-year-old patient lying in the bed with no acute distress.  EYES: Pupils equal, round, reactive to light and accommodation. No scleral icterus.   HEENT: Head atraumatic, normocephalic. Oropharynx and nasopharynx clear.  NECK:  Supple, no jugular venous distention. No thyroid enlargement, no tenderness.  LUNGS: Normal breath sounds bilaterally, no wheezing, some crepitation. No use of accessory muscles of respiration.  CARDIOVASCULAR: S1, S2 normal. No murmurs, rubs, or gallops.  ABDOMEN: Soft, nontender, nondistended. Bowel sounds present. No organomegaly or mass.  EXTREMITIES: No pedal edema, cyanosis, or clubbing.  NEUROLOGIC: pt is drowsy, arousable to stimuli, not much following commands. PSYCHIATRIC: The patient is drowsy.  SKIN: No obvious rash, lesion, or ulcer.   Physical Exam LABORATORY PANEL:   CBC  Recent Labs Lab 11/23/16 1758  WBC 12.7*  HGB 12.0  HCT 37.6  PLT 215   ------------------------------------------------------------------------------------------------------------------  Chemistries   Recent Labs Lab 11/23/16 1758  NA 138  K 3.5   CL 103  CO2 25  GLUCOSE 109*  BUN 14  CREATININE 0.61  CALCIUM 9.1  AST 20  ALT 17  ALKPHOS 100  BILITOT 0.6   ------------------------------------------------------------------------------------------------------------------  Cardiac Enzymes  Recent Labs Lab 11/23/16 1758  TROPONINI <0.03   ------------------------------------------------------------------------------------------------------------------  RADIOLOGY:  Dg Chest Port 1 View  Result Date: 11/23/2016 CLINICAL DATA:  Fever, altered mental status EXAM: PORTABLE CHEST 1 VIEW COMPARISON:  08/26/2016 FINDINGS: Mild right lower lobe opacity, pneumonia not excluded. Left lung is clear. No pleural effusion or pneumothorax. The heart is normal in size. IMPRESSION: Mild right lower lobe opacity, pneumonia not excluded. Electronically Signed   By: Charline BillsSriyesh  Krishnan M.D.   On: 11/23/2016 19:06    ASSESSMENT AND PLAN:   Active Problems:   Altered mental status  * Acute metabolic encephalopathy - Likely due to infection.  Underlying severe dementia -  valproic acid level low. - Consulted neurology - She may become agitated and require sitter  * Possible pneumonia: Seen on chest x-ray, also had low-grade fever, and leukocytosis - Hospital/healthcare acquired as she leaves at [facility] unit - Cover with empiric antibiotics - We will obtain blood and sputum culture  * Dementia with behavioral disturbance and anxiety. Depakote should also help out with mood stabilization. Continue Zoloft, nortriptyline and Risperidal.  * Asthma. Respiratory status stable on nebulizer treatments. On chronic prednisone for chronic bronchitis  * Hyperlipidemia unspecified on atorvastatin  * Essential hypertension - Continue Lasix and monitor blood pressure    All the records are reviewed and case discussed with Care Management/Social Workerr. Management plans discussed  with the patient, family and they are in  agreement.  CODE STATUS: DNR  TOTAL TIME TAKING CARE OF THIS PATIENT: 35 minutes.     POSSIBLE D/C IN 1-2 DAYS, DEPENDING ON CLINICAL CONDITION.   Altamese DillingVACHHANI, Kedra Mcglade M.D on 11/24/2016   Between 7am to 6pm - Pager - [phone]  After 6pm go to [email or web address] - password [email or web address]  [facility] [place] [facility]  Office  [phone]  CC: Primary care physician; Clovis PuVan Horn, Arturo MortonJill E, DO  Note: This dictation was prepared with Dragon dictation along with smaller phrase technology. Any transcriptional errors that result from this process are unintentional.

## 2016-11-24 NOTE — Progress Notes (Signed)
Patient has yellow, red and purple arm band on.

## 2016-11-25 DIAGNOSIS — J181 Lobar pneumonia, unspecified organism: Secondary | ICD-10-CM

## 2016-11-25 DIAGNOSIS — R400 Somnolence: Secondary | ICD-10-CM

## 2016-11-25 DIAGNOSIS — J189 Pneumonia, unspecified organism: Secondary | ICD-10-CM

## 2016-11-25 DIAGNOSIS — Z515 Encounter for palliative care: Secondary | ICD-10-CM

## 2016-11-25 DIAGNOSIS — Z7189 Other specified counseling: Secondary | ICD-10-CM

## 2016-11-25 LAB — CBC
HEMATOCRIT: 34 % — AB (ref 35.0–47.0)
Hemoglobin: 11 g/dL — ABNORMAL LOW (ref 12.0–16.0)
MCH: 23.1 pg — ABNORMAL LOW (ref 26.0–34.0)
MCHC: 32.4 g/dL (ref 32.0–36.0)
MCV: 71.5 fL — AB (ref 80.0–100.0)
PLATELETS: 185 10*3/uL (ref 150–440)
RBC: 4.75 MIL/uL (ref 3.80–5.20)
RDW: 32 % — AB (ref 11.5–14.5)
WBC: 9.9 10*3/uL (ref 3.6–11.0)

## 2016-11-25 LAB — PROCALCITONIN

## 2016-11-25 LAB — URINE CULTURE

## 2016-11-25 LAB — BASIC METABOLIC PANEL
ANION GAP: 9 (ref 5–15)
BUN: 14 mg/dL (ref 6–20)
CALCIUM: 8.8 mg/dL — AB (ref 8.9–10.3)
CO2: 27 mmol/L (ref 22–32)
Chloride: 103 mmol/L (ref 101–111)
Creatinine, Ser: 0.7 mg/dL (ref 0.44–1.00)
Glucose, Bld: 97 mg/dL (ref 65–99)
POTASSIUM: 3.3 mmol/L — AB (ref 3.5–5.1)
Sodium: 139 mmol/L (ref 135–145)

## 2016-11-25 LAB — HIV ANTIBODY (ROUTINE TESTING W REFLEX): HIV Screen 4th Generation wRfx: NONREACTIVE

## 2016-11-25 MED ORDER — POTASSIUM CHLORIDE CRYS ER 20 MEQ PO TBCR
20.0000 meq | EXTENDED_RELEASE_TABLET | Freq: Two times a day (BID) | ORAL | Status: DC
  Administered 2016-11-25 – 2016-11-28 (×6): 20 meq via ORAL
  Filled 2016-11-25 (×7): qty 1

## 2016-11-25 MED ORDER — IPRATROPIUM-ALBUTEROL 0.5-2.5 (3) MG/3ML IN SOLN
3.0000 mL | Freq: Three times a day (TID) | RESPIRATORY_TRACT | Status: DC
  Administered 2016-11-25 – 2016-11-26 (×4): 3 mL via RESPIRATORY_TRACT
  Filled 2016-11-25 (×4): qty 3

## 2016-11-25 NOTE — Progress Notes (Signed)
[employer] - [place] [facility]   PATIENT NAME: Jessica NephewLinda Webster    MR#:  [id number]  DATE OF BIRTH:  [birth year]  SUBJECTIVE:  CHIEF COMPLAINT:   Chief Complaint  Patient presents with  . Fever  . Hypertension  . Altered Mental Status  alzheimer's, lives in memory care- brought after having more confusion, not eating well. Found to have pneumonia.  More alert today, but confused.  REVIEW OF SYSTEMS:  Pt is confused , not giving ROS>  ROS  DRUG ALLERGIES:   Allergies  Allergen Reactions  . Erythromycin Hives  . Sulfa Antibiotics Hives  . Vancomycin Rash    Noted redness at IV site during infusion.     VITALS:  Blood pressure 130/68, pulse 87, temperature 99 F (37.2 C), temperature source Axillary, resp. rate 18, height 5\' 7"  (1.702 m), weight 68.9 kg (151 lb 14.4 oz), SpO2 95 %.  PHYSICAL EXAMINATION:  GENERAL:  71 y.o.-year-old patient lying in the bed with no acute distress.  EYES: Pupils equal, round, reactive to light and accommodation. No scleral icterus.   HEENT: Head atraumatic, normocephalic. Oropharynx and nasopharynx clear.  NECK:  Supple, no jugular venous distention. No thyroid enlargement, no tenderness.  LUNGS: Normal breath sounds bilaterally, no wheezing, some crepitation. No use of accessory muscles of respiration.  CARDIOVASCULAR: S1, S2 normal. No murmurs, rubs, or gallops.  ABDOMEN: Soft, nontender, nondistended. Bowel sounds present. No organomegaly or mass.  EXTREMITIES: No pedal edema, cyanosis, or clubbing.  NEUROLOGIC: pt is confused, awake, not much following commands. Moves limbs spontaneously. PSYCHIATRIC: The patient is confused.  SKIN: No obvious rash, lesion, or ulcer.   Physical Exam LABORATORY PANEL:   CBC  Recent Labs Lab 11/25/16 0431  WBC 9.9  HGB 11.0*  HCT 34.0*  PLT 185   ------------------------------------------------------------------------------------------------------------------  Chemistries    Recent Labs Lab 11/23/16 1758 11/25/16 0431  NA 138 139  K 3.5 3.3*  CL 103 103  CO2 25 27  GLUCOSE 109* 97  BUN 14 14  CREATININE 0.61 0.70  CALCIUM 9.1 8.8*  AST 20  --   ALT 17  --   ALKPHOS 100  --   BILITOT 0.6  --    ------------------------------------------------------------------------------------------------------------------  Cardiac Enzymes  Recent Labs Lab 11/23/16 1758  TROPONINI <0.03   ------------------------------------------------------------------------------------------------------------------  RADIOLOGY:  Dg Chest Port 1 View  Result Date: 11/23/2016 CLINICAL DATA:  Fever, altered mental status EXAM: PORTABLE CHEST 1 VIEW COMPARISON:  08/26/2016 FINDINGS: Mild right lower lobe opacity, pneumonia not excluded. Left lung is clear. No pleural effusion or pneumothorax. The heart is normal in size. IMPRESSION: Mild right lower lobe opacity, pneumonia not excluded. Electronically Signed   By: Jessica Webster.   On: 11/23/2016 19:06    ASSESSMENT AND PLAN:   Active Problems:   Altered mental status  * Acute metabolic encephalopathy - Likely due to infection.  Underlying severe dementia -  valproic acid level low. - Consulted neurology, no further work ups.  *  pneumonia: Seen on chest x-ray, also had low-grade fever, and leukocytosis - Hospital/healthcare acquired as she leaves at [facility] memory care unit - Cover with empiric antibiotics - We will obtain blood and sputum culture - she has improvement in condition today, though procalcitonin is negative, I will treat total for 5 days for Pneumonia.  * Dementia with behavioral disturbance and anxiety. Depakote should also help out with mood stabilization. Continue Zoloft, nortriptyline and Risperidal.  * Asthma. Respiratory status  stable on nebulizer treatments. On chronic prednisone for chronic bronchitis  * Hyperlipidemia unspecified on atorvastatin  * Essential  hypertension - Continue Lasix and monitor blood pressure   All the records are reviewed and case discussed with Care Management/Social Workerr. Management plans discussed with the patient, family and they are in agreement.  CODE STATUS: DNR  TOTAL TIME TAKING CARE OF THIS PATIENT: 35 minutes.   She have some improvement in mental status today, discussed with her husband, will cont Abx and monitor, if she improves much- can go back to her facility, if not much- he may agree on hospice in 1-2 days.  POSSIBLE D/C IN 1-2 DAYS, DEPENDING ON CLINICAL CONDITION.   Jessica Webster, Jessica Webster on 11/25/2016   Between 7am to 6pm - Pager - [phone]  After 6pm go to [email or web address] - password [email or web address] [email or web address]  [facility] [place] [facility]  Office  [phone]  CC: Primary care physician; Jessica Webster, Jessica MortonJill Webster, Jessica Webster  Note: This dictation was prepared with Dragon dictation along with smaller phrase technology. Any transcriptional errors that result from this process are unintentional.

## 2016-11-25 NOTE — Progress Notes (Signed)
Palliative Medicine consult noted. Due to high referral volume, there may be a delay seeing this patient. Please call the Palliative Medicine Team office at [phone] if recommendations are needed in the interim.  Thank you for inviting us to see this patient.  Sheniya Garciaperez G. Britt Petroni, RN, BSN, CHPN 11/25/2016 12:11 PM Cell [phone] 8:00-4:00 Monday-Friday Office [phone] 

## 2016-11-25 NOTE — Progress Notes (Signed)
Pharmacy Antibiotic Note  Jessica Webster is a 71 y.o. female admitted on 11/23/2016 with PNA.  Pharmacy has been consulted for vancomycin dosing.  Plan: Continue Vancomycin 1.25 g IV q18 hours. Vancomycin Trough level ordered to be drawn @ 1130 on 8/4. MRSA PCR pending   Continue Cefepime 1 g q8 hours.   Height: 5\' 7"  (170.2 cm) Weight: 151 lb 14.4 oz (68.9 kg) IBW/kg (Calculated) : 61.6  Temp (24hrs), Avg:98.7 F (37.1 C), Min:98 F (36.7 C), Max:99.4 F (37.4 C)   Recent Labs Lab 11/23/16 1758 11/25/16 0431  WBC 12.7* 9.9  CREATININE 0.61 0.70  LATICACIDVEN 1.1  --     Estimated Creatinine Clearance: 62.7 mL/min (by C-G formula based on SCr of 0.7 mg/dL).    Allergies  Allergen Reactions  . Erythromycin Hives  . Sulfa Antibiotics Hives  . Vancomycin Rash    Noted redness at IV site during infusion.     Thank you for allowing pharmacy to be a part of this patient's care.  Mela Perham D, Pharm.D., BCPS Clinical Pharmacist 11/25/2016 8:33 AM

## 2016-11-25 NOTE — Progress Notes (Signed)
Subjective: Patient more awake today.  Conversing.  Attempting to feed self.    Objective: Current vital signs: BP 130/68 (BP Location: Left Arm)   Pulse 87   Temp 99 F (37.2 C) (Axillary)   Resp 18   Ht 5\' 7"  (1.702 m)   Wt 68.9 kg (151 lb 14.4 oz)   SpO2 95%   BMI 23.79 kg/m  Vital signs in last 24 hours: Temp:  [98 F (36.7 C)-99 F (37.2 C)] 99 F (37.2 C) (08/03 1223) Pulse Rate:  [87-91] 87 (08/03 1223) Resp:  [18-20] 18 (08/03 1223) BP: (128-143)/(68-87) 130/68 (08/03 1223) SpO2:  [93 %-97 %] 95 % (08/03 1413)  Intake/Output from previous day: 08/02 0701 - 08/03 0700 In: -  Out: 1700 [Urine:1700] Intake/Output this shift: No intake/output data recorded. Nutritional status: Diet heart healthy/carb modified Room service appropriate? Yes; Fluid consistency: Thin  Neurologic Exam: Mental Status: Lethargic but easily awakened.  Eyes open.  Follows some simple commands..  Able to respond verbally Cranial Nerves: II: Discs flat bilaterally; Blinks to bilateral confrontation, pupils equal, round, reactive to light and accommodation III,IV, VI: ptosis not present, extra-ocular motions intact bilaterally V,VII: grimaces symmetrically VIII: unable to test IX,X: gag reflex reduced XI: unable to test XII: unable to test Motor: Moves all extremities spontaneously.  No focal weakness noted.     Lab Results: Basic Metabolic Panel:  Recent Labs Lab 11/23/16 1758 11/25/16 0431  NA 138 139  K 3.5 3.3*  CL 103 103  CO2 25 27  GLUCOSE 109* 97  BUN 14 14  CREATININE 0.61 0.70  CALCIUM 9.1 8.8*    Liver Function Tests:  Recent Labs Lab 11/23/16 1758  AST 20  ALT 17  ALKPHOS 100  BILITOT 0.6  PROT 7.5  ALBUMIN 3.6   No results for input(s): LIPASE, AMYLASE in the last 168 hours. No results for input(s): AMMONIA in the last 168 hours.  CBC:  Recent Labs Lab 11/23/16 1758 11/25/16 0431  WBC 12.7* 9.9  NEUTROABS 9.5*  --   HGB 12.0 11.0*  HCT 37.6  34.0*  MCV 70.8* 71.5*  PLT 215 185    Cardiac Enzymes:  Recent Labs Lab 11/23/16 1758  TROPONINI <0.03    Lipid Panel: No results for input(s): CHOL, TRIG, HDL, CHOLHDL, VLDL, LDLCALC in the last 168 hours.  CBG: No results for input(s): GLUCAP in the last 168 hours.  Microbiology: Results for orders placed or performed during the hospital encounter of 11/23/16  Blood Culture (routine x 2)     Status: None (Preliminary result)   Collection Time: 11/23/16  5:58 PM  Result Value Ref Range Status   Specimen Description BLOOD BLOOD LEFT HAND  Final   Special Requests   Final    BOTTLES DRAWN AEROBIC AND ANAEROBIC Blood Culture adequate volume   Culture NO GROWTH 2 DAYS  Final   Report Status PENDING  Incomplete  Urine culture     Status: Abnormal   Collection Time: 11/23/16  5:58 PM  Result Value Ref Range Status   Specimen Description URINE, RANDOM  Final   Special Requests NONE  Final   Culture (A)  Final    <10,000 COLONIES/mL INSIGNIFICANT GROWTH Performed at [facility], [address] [place], [place] [address]    Report Status 11/25/2016 FINAL  Final  Blood Culture (routine x 2)     Status: None (Preliminary result)   Collection Time: 11/23/16  7:08 PM  Result Value Ref  Range Status   Specimen Description BLOOD BLOOD RIGHT HAND  Final   Special Requests   Final    BOTTLES DRAWN AEROBIC AND ANAEROBIC Blood Culture adequate volume   Culture NO GROWTH 2 DAYS  Final   Report Status PENDING  Incomplete    Coagulation Studies: No results for input(s): LABPROT, INR in the last 72 hours.  Imaging: Dg Chest Port 1 View  Result Date: 11/23/2016 CLINICAL DATA:  Fever, altered mental status EXAM: PORTABLE CHEST 1 VIEW COMPARISON:  08/26/2016 FINDINGS: Mild right lower lobe opacity, pneumonia not excluded. Left lung is clear. No pleural effusion or pneumothorax. The heart is normal in size. IMPRESSION: Mild right lower lobe opacity, pneumonia not excluded.  Electronically Signed   By: Charline BillsSriyesh  Krishnan M.D.   On: 11/23/2016 19:06    Medications:  I have reviewed the patient's current medications. Scheduled: . ALPRAZolam  0.25 mg Oral BID  . arformoterol  15 mcg Nebulization BID  . aspirin EC  81 mg Oral Daily  . atorvastatin  20 mg Oral QHS  . budesonide  0.5 mg Nebulization BID  . buPROPion  150 mg Oral Daily  . divalproex  250 mg Oral Q12H  . donepezil  10 mg Oral q morning - 10a  . enoxaparin (LOVENOX) injection  40 mg Subcutaneous Q24H  . ferrous sulfate  325 mg Oral Q breakfast  . fluticasone  2 spray Each Nare Daily  . furosemide  20 mg Intravenous Daily  . ipratropium-albuterol  3 mL Nebulization TID  . memantine  10 mg Oral BID  . montelukast  10 mg Oral q morning - 10a  . nortriptyline  20 mg Oral QHS  . pantoprazole  40 mg Oral Daily  . potassium chloride  20 mEq Oral BID  . predniSONE  5 mg Oral Q breakfast  . risperiDONE  0.5 mg Oral BID  . saccharomyces boulardii  250 mg Oral BID  . sertraline  100 mg Oral QHS    Assessment/Plan: Patient improving.  Continues on Vancomycin.  Will continue to follow prn.    LOS: 2 days   Thana FarrLeslie Yamilee Harmes, MD Neurology [phone] 11/25/2016  4:53 PM

## 2016-11-25 NOTE — Evaluation (Signed)
Physical Therapy Evaluation Patient Details Name: Jessica NephewLinda Webster MRN: [id number] DOB: 1946-03-26 Today's Date: 11/25/2016   History of Present Illness  presented to ER secondary to worsening mental status, decreased responsiveness at ALF; admitted with pneumonia.  Clinical Impression  Upon evaluation, patient alert, generally restless; constantly fidgeting, grabbing sheets/clothing throughout session.  Assists therapist with act assist ROM throughout all extremities, but demonstrates generalized weakness throughout. Requires hand-over-hand assist at all times to initiate/complete purposeful movement on command.  Requires heavy max/dep assist from therapist for bed mobility and unsupported sitting balance.  Heavy R lateral lean with absent balance/righting reactions in sitting position; limited ability to facilitate progression towards more neutral alignment (patient very fearful of weight shift outside of her perceived midline orientation).  Additional OOB activity deferred; unsafe for patient at this time. Would benefit from skilled PT to address above deficits (primarily bed mobility, sitting balance and basic transfers as able) and promote optimal return to PLOF, as patient demonstrates decline in functional ability even from previous admission approx 3 weeks ago; Recommend return to memory care (familiar environment) with HHPT upon discharge from acute hospitalization, pending ability to participate/progress with skilled intervention and overall goals of care (note palliative consult).      Follow Up Recommendations Home health PT (pending ability to participate/progress)    Equipment Recommendations       Recommendations for Other Services       Precautions / Restrictions Precautions Precautions: Fall Restrictions Weight Bearing Restrictions: No      Mobility  Bed Mobility   Bed Mobility: Supine to Sit;Sit to Supine     Supine to sit: Max assist;Total assist Sit to supine: Max  assist;Total assist   General bed mobility comments: extensive assist for LE management and truncal elevation; difficulty with release of grasp from bedrail for neutral positioning. Heavy R lateral lean in unsupported sitting with absent balance/righting reactions.  Transfers                 General transfer comment: unsafe/unable  Ambulation/Gait             General Gait Details: unsafe/unable  Stairs            Wheelchair Mobility    Modified Rankin (Stroke Patients Only)       Balance Overall balance assessment: Needs assistance   Sitting balance-Leahy Scale: Zero Sitting balance - Comments: heavy R lateral lean, absent righting reactions                                     Pertinent Vitals/Pain Pain Assessment: No/denies pain    Home Living Family/patient expects to be discharged to:: Assisted living                 Additional Comments: Resident of [facility] memory care    Prior Function Level of Independence: Needs assistance         Comments: Utilizes transport chair as primary mobility (mobilizes with bilat LEs per husband).  Requires extensive assist from staff for transfes to/from; unable to acheive full, uprigh stance when transferring.  Full assist for ADLs; able to feed self.     Hand Dominance        Extremity/Trunk Assessment   Upper Extremity Assessment Upper Extremity Assessment:  (grossly at least 3/5 throughout)    Lower Extremity Assessment Lower Extremity Assessment:  (grossly at least 3/5 throughout)  Communication   Communication:  (patient speech very soft, non-sensical majority of session. Unable to actively engage in dialogue/conversation)  Cognition Arousal/Alertness: Awake/alert Behavior During Therapy: Restless Overall Cognitive Status: History of cognitive impairments - at baseline                                 General Comments: inconsistently follows  simple, verbal commands; constantly fidgeting with hands.       General Comments      Exercises Other Exercises Other Exercises: attempted to promote progression towards neutral sitting position in M/L and A/P planes.  PAtient generally resistant to faciltiation/correction from therapist; very fearful of any weight shift outside of her perceived neutral position.  Max/dep assist to maintain   Assessment/Plan    PT Assessment Patient needs continued PT services  PT Problem List Decreased strength;Decreased activity tolerance;Decreased balance;Decreased mobility;Decreased cognition;Decreased coordination;Decreased knowledge of use of DME;Decreased safety awareness;Decreased knowledge of precautions       PT Treatment Interventions Gait training;Therapeutic activities;Therapeutic exercise;Functional mobility training;Balance training;Patient/family education;DME instruction;Neuromuscular re-education    PT Goals (Current goals can be found in the Care Plan section)  Acute Rehab PT Goals Patient Stated Goal: per husband ,to return to [facility] PT Goal Formulation: With family Time For Goal Achievement: 12/09/16 Potential to Achieve Goals: Fair Additional Goals Additional Goal #1: Assess and establish goals for OOB as appropriate    Frequency Min 2X/week   Barriers to discharge        Co-evaluation               AM-PAC PT "6 Clicks" Daily Activity  Outcome Measure Difficulty turning over in bed (including adjusting bedclothes, sheets and blankets)?: Total Difficulty moving from lying on back to sitting on the side of the bed? : Total Difficulty sitting down on and standing up from a chair with arms (e.g., wheelchair, bedside commode, etc,.)?: Total Help needed moving to and from a bed to chair (including a wheelchair)?: Total Help needed walking in hospital room?: Total Help needed climbing 3-5 steps with a railing? : Total 6 Click Score: 6    End of Session    Activity Tolerance: Patient limited by fatigue Patient left: in bed;with call bell/phone within reach;with bed alarm set;with family/visitor present Nurse Communication: Mobility status PT Visit Diagnosis: Other abnormalities of gait and mobility (R26.89);Muscle weakness (generalized) (M62.81)    Time: [id number] PT Time Calculation (min) (ACUTE ONLY): 22 min   Charges:   PT Evaluation $PT Eval Low Complexity: 1 Low PT Treatments $Therapeutic Activity: 8-22 mins   PT G Codes:   PT G-Codes **NOT FOR INPATIENT CLASS** Functional Assessment Tool Used: AM-PAC 6 Clicks Basic Mobility Functional Limitation: Mobility: Walking and moving around Mobility: Walking and Moving Around Current Status (N8295(G8978): 100 percent impaired, limited or restricted Mobility: Walking and Moving Around Goal Status (A2130(G8979): At least 20 percent but less than 40 percent impaired, limited or restricted    Jessica Webster, PT, DPT, NCS 11/25/16, 10:39 PM [phone]

## 2016-11-25 NOTE — Consult Note (Signed)
Consultation Note Date: 11/25/2016   Patient Name: Jessica Webster  DOB: [birth year]  MRN: [id number]  Age / Sex: 71 y.o., female  PCP: Velta Addison, Claretha Cooper, DO Referring Physician: Vaughan Basta, *  Reason for Consultation: Establishing goals of care  HPI/Patient Profile: 71 y.o. female  with past medical history of Alzheimer's dementia, anxiety, HTN, asthma admitted on 11/23/2016 with altered mental status and fever and we are treating for pneumonia. She has continued to be somnolent but a little more alert today.   Clinical Assessment and Goals of Care: I met today at Jessica Webster's bedside with her husband. Explained palliative care to him. We discussed the natural progression of dementia and potential complications that he may face in the future. He confirmed DNR and says that he would not want a feeding tube. He shares that he cared for his son and had to make these decisions for him and had hospice care.   We were able to discuss QOL and how this can affect decisions for aggressiveness of care as well. He completely agrees and knows that his wife (and himself) would not want to live this way. He has worked very hard to obtain resources and be with her to optimize her QOL. Unfortunately her baseline had declined after her admission a month ago for seizure. She was wheelchair bound but could feed herself and was eating well but otherwise dependent on all ADLs. He is watchful waiting but understands that she may not improve much. He understands that they may need hospice at some point as well.   Primary Decision Maker NEXT OF KIN husband Jessica Webster    SUMMARY OF RECOMMENDATIONS   - Watchful waiting to see if she will improve - Husband does not want aggressive interventions (no feeding tubes) - Understands that she may need hospice at some point  Code Status/Advance Care Planning:  DNR   Symptom Management:     Poor intake: Continue to offer foods frequently. Try to have foods she enjoys. Offer food to her even if she says no as she may not know.   Anxiety chronic: Continue Xanax BID at home dose.   Palliative Prophylaxis:   Aspiration, Bowel Regimen, Delirium Protocol, Oral Care and Turn Reposition  Additional Recommendations (Limitations, Scope, Preferences):  No Artificial Feeding  Psycho-social/Spiritual:   Desire for further Chaplaincy support:no  Additional Recommendations: Caregiving  Support/Resources and Education on Hospice  Prognosis:   Unable to determine - depends on the next couple days.   Discharge Planning: To Be Determined      Primary Diagnoses: Present on Admission: . Altered mental status   I have reviewed the medical record, interviewed the patient and family, and examined the patient. The following aspects are pertinent.  Past Medical History:  Diagnosis Date  . Alzheimer disease   . Anxiety   . Asthma   . Hypertension    Social History   Social History  . Marital status: Married    Spouse name: N/A  . Number of children: N/A  .  Years of education: N/A   Social History Main Topics  . Smoking status: Former Research scientist (life sciences)  . Smokeless tobacco: Former Systems developer  . Alcohol use No  . Drug use: No  . Sexual activity: Not Asked   Other Topics Concern  . None   Social History Narrative  . None   Family History  Problem Relation Age of Onset  . Asthma Father   . Brain cancer Brother    Scheduled Meds: . ALPRAZolam  0.25 mg Oral BID  . arformoterol  15 mcg Nebulization BID  . aspirin EC  81 mg Oral Daily  . atorvastatin  20 mg Oral QHS  . budesonide  0.5 mg Nebulization BID  . buPROPion  150 mg Oral Daily  . divalproex  250 mg Oral Q12H  . donepezil  10 mg Oral q morning - 10a  . enoxaparin (LOVENOX) injection  40 mg Subcutaneous Q24H  . ferrous sulfate  325 mg Oral Q breakfast  . fluticasone  2 spray Each Nare Daily  . furosemide  20 mg  Intravenous Daily  . ipratropium-albuterol  3 mL Nebulization TID  . memantine  10 mg Oral BID  . montelukast  10 mg Oral q morning - 10a  . nortriptyline  20 mg Oral QHS  . pantoprazole  40 mg Oral Daily  . potassium chloride  20 mEq Oral BID  . predniSONE  5 mg Oral Q breakfast  . risperiDONE  0.5 mg Oral BID  . saccharomyces boulardii  250 mg Oral BID  . sertraline  100 mg Oral QHS   Continuous Infusions: . ceFEPime (MAXIPIME) IV Stopped (11/25/16 1227)  . vancomycin Stopped (11/25/16 0116)   PRN Meds:.acetaminophen, loperamide Allergies  Allergen Reactions  . Erythromycin Hives  . Sulfa Antibiotics Hives  . Vancomycin Rash    Noted redness at IV site during infusion.    Review of Systems  Unable to perform ROS: Dementia    Physical Exam  Constitutional: She appears well-developed.  HENT:  Head: Normocephalic and atraumatic.  Cardiovascular: Normal rate and regular rhythm.   Pulmonary/Chest: Effort normal. No accessory muscle usage. No tachypnea. No respiratory distress.  Abdominal: Normal appearance.  Neurological: She is alert. She is disoriented.  Nursing note and vitals reviewed.   Vital Signs: BP 130/68 (BP Location: Left Arm)   Pulse 87   Temp 99 F (37.2 C) (Axillary)   Resp 18   Ht '5\' 7"'$  (1.702 m)   Wt 68.9 kg (151 lb 14.4 oz)   SpO2 95%   BMI 23.79 kg/m  Pain Assessment: 0-10   Pain Score: 0-No pain   SpO2: SpO2: 95 % O2 Device:SpO2: 95 % O2 Flow Rate: .O2 Flow Rate (L/min): 0 L/min  IO: Intake/output summary:  Intake/Output Summary (Last 24 hours) at 11/25/16 1702 Last data filed at 11/25/16 0452  Gross per 24 hour  Intake                0 ml  Output             1700 ml  Net            -1700 ml    LBM:   Baseline Weight: Weight: 70.8 kg (156 lb) Most recent weight: Weight: 68.9 kg (151 lb 14.4 oz)     Palliative Assessment/Data: 30%     Time Total: 52mn Greater than 50%  of this time was spent counseling and coordinating care  related to the above assessment and plan.  Signed by: Vinie Sill, NP Palliative Medicine Team Pager # [phone] (M-F 8a-5p) Team Phone # [phone] (Nights/Weekends)

## 2016-11-26 LAB — BASIC METABOLIC PANEL
ANION GAP: 9 (ref 5–15)
BUN: 19 mg/dL (ref 6–20)
CALCIUM: 8.7 mg/dL — AB (ref 8.9–10.3)
CO2: 27 mmol/L (ref 22–32)
Chloride: 104 mmol/L (ref 101–111)
Creatinine, Ser: 0.72 mg/dL (ref 0.44–1.00)
GFR calc Af Amer: >60 mL/min (ref >60)
GLUCOSE: 105 mg/dL — AB (ref 65–99)
Potassium: 3.6 mmol/L (ref 3.5–5.1)
SODIUM: 140 mmol/L (ref 135–145)

## 2016-11-26 LAB — MRSA PCR SCREENING: MRSA BY PCR: POSITIVE — AB

## 2016-11-26 MED ORDER — IPRATROPIUM-ALBUTEROL 0.5-2.5 (3) MG/3ML IN SOLN
3.0000 mL | Freq: Two times a day (BID) | RESPIRATORY_TRACT | Status: DC
  Administered 2016-11-26 – 2016-11-28 (×4): 3 mL via RESPIRATORY_TRACT
  Filled 2016-11-26 (×4): qty 3

## 2016-11-26 MED ORDER — CHLORHEXIDINE GLUCONATE CLOTH 2 % EX PADS
6.0000 | MEDICATED_PAD | Freq: Every day | CUTANEOUS | Status: DC
  Administered 2016-11-27 – 2016-11-28 (×2): 6 via TOPICAL

## 2016-11-26 MED ORDER — MUPIROCIN 2 % EX OINT
1.0000 "application " | TOPICAL_OINTMENT | Freq: Two times a day (BID) | CUTANEOUS | Status: DC
  Administered 2016-11-26 – 2016-11-28 (×5): 1 via NASAL
  Filled 2016-11-26: qty 22

## 2016-11-26 NOTE — Progress Notes (Signed)
[facility] [place] [facility]   PATIENT NAME: Jessica NephewLinda Webster    MR#:  [id number]  DATE OF BIRTH:  [birth year]  SUBJECTIVE:  CHIEF COMPLAINT:   Chief Complaint  Patient presents with  . Fever  . Hypertension  . Altered Mental Status  alzheimer's, lives in memory care- brought after having more confusion, not eating well. Found to have pneumonia.  More alert today, but confused. Daughter and husband in room said, now she is feeding good with assistance.  REVIEW OF SYSTEMS:  Pt is confused , not giving ROS>  ROS  DRUG ALLERGIES:   Allergies  Allergen Reactions  . Erythromycin Hives  . Sulfa Antibiotics Hives  . Vancomycin Rash    Noted redness at IV site during infusion.     VITALS:  Blood pressure 114/63, pulse 69, temperature 97.8 F (36.6 C), temperature source Oral, resp. rate 18, height 5\' 7"  (1.702 m), weight 68.9 kg (151 lb 14.4 oz), SpO2 94 %.  PHYSICAL EXAMINATION:  GENERAL:  71 y.o.-year-old patient lying in the bed with no acute distress.  EYES: Pupils equal, round, reactive to light and accommodation. No scleral icterus.   HEENT: Head atraumatic, normocephalic. Oropharynx and nasopharynx clear.  NECK:  Supple, no jugular venous distention. No thyroid enlargement, no tenderness.  LUNGS: Normal breath sounds bilaterally, no wheezing, some crepitation. No use of accessory muscles of respiration.  CARDIOVASCULAR: S1, S2 normal. No murmurs, rubs, or gallops.  ABDOMEN: Soft, nontender, nondistended. Bowel sounds present. No organomegaly or mass.  EXTREMITIES: No pedal edema, cyanosis, or clubbing.  NEUROLOGIC: pt is confused, awake, not much following commands. Moves limbs spontaneously. PSYCHIATRIC: The patient is confused.  SKIN: No obvious rash, lesion, or ulcer.   Physical Exam LABORATORY PANEL:   CBC  Recent Labs Lab 11/25/16 0431  WBC 9.9  HGB 11.0*  HCT 34.0*  PLT 185    ------------------------------------------------------------------------------------------------------------------  Chemistries   Recent Labs Lab 11/23/16 1758  11/26/16 0556  NA 138  < > 140  K 3.5  < > 3.6  CL 103  < > 104  CO2 25  < > 27  GLUCOSE 109*  < > 105*  BUN 14  < > 19  CREATININE 0.61  < > 0.72  CALCIUM 9.1  < > 8.7*  AST 20  --   --   ALT 17  --   --   ALKPHOS 100  --   --   BILITOT 0.6  --   --   < > = values in this interval not displayed. ------------------------------------------------------------------------------------------------------------------  Cardiac Enzymes  Recent Labs Lab 11/23/16 1758  TROPONINI <0.03   ------------------------------------------------------------------------------------------------------------------  RADIOLOGY:  No results found.  ASSESSMENT AND PLAN:   Active Problems:   Altered mental status   Community acquired pneumonia of right lower lobe of lung (HCC)   Goals of care, counseling/discussion   Palliative care encounter  * Acute metabolic encephalopathy - Likely due to infection.  Underlying severe dementia -  valproic acid level low. - Consulted neurology, no further work ups.  *  pneumonia: Seen on chest x-ray, also had low-grade fever, and leukocytosis - Hospital/healthcare acquired as she leaves at [facility] - Cover with empiric antibiotics - blood cx negative - she has improvement in condition today, though procalcitonin is negative, I will treat total for 5 days for Pneumonia.  * Dementia with behavioral disturbance and anxiety. Depakote should also help out with mood stabilization. Continue Zoloft, nortriptyline  and Risperidal.  * Asthma. Respiratory status stable on nebulizer treatments. On chronic prednisone for chronic bronchitis  * Hyperlipidemia unspecified on atorvastatin  * Essential hypertension - Continue Lasix and monitor blood pressure   All the records  are reviewed and case discussed with Care Management/Social Workerr. Management plans discussed with the patient, family and they are in agreement.  CODE STATUS: DNR  TOTAL TIME TAKING CARE OF THIS PATIENT: 35 minutes.   She have some improvement in mental status today, discussed with her husband, will cont Abx and monitor, if she improves much- can go back to her facility, if not much- he may agree on hospice in 1-2 days.  POSSIBLE D/C IN 1-2 DAYS, DEPENDING ON CLINICAL CONDITION.   Jessica DillingVACHHANI, Jessica Webster M.D on 11/26/2016   Between 7am to 6pm - Pager - [phone]  After 6pm go to [email or web address] - password [email or web address] [email or web address]  [facility] [place] [facility]  Office  [phone]  CC: Primary care physician; Clovis PuVan Webster, Jessica MortonJill E, DO  Note: This dictation was prepared with Dragon dictation along with smaller phrase technology. Any transcriptional errors that result from this process are unintentional.

## 2016-11-26 NOTE — Progress Notes (Signed)
Patient's MRSA PCR is positive, standing MRSA PCR orders placed.  Patient will remain on contact isolation.  Dr. Esaw GrandchildVachanni made aware.  Orson Apeanielle Ammi Hutt, RN

## 2016-11-27 LAB — PROCALCITONIN

## 2016-11-27 LAB — VANCOMYCIN, TROUGH: VANCOMYCIN TR: 9 ug/mL — AB (ref 15–20)

## 2016-11-27 MED ORDER — VANCOMYCIN HCL IN DEXTROSE 750-5 MG/150ML-% IV SOLN
750.0000 mg | Freq: Two times a day (BID) | INTRAVENOUS | Status: DC
  Administered 2016-11-27 – 2016-11-28 (×3): 750 mg via INTRAVENOUS
  Filled 2016-11-27 (×5): qty 150

## 2016-11-27 NOTE — Clinical Social Work Note (Signed)
Clinical Social Work Assessment  Patient Details  Name: Jessica NephewLinda Webster MRN: [id number] Date of Birth: 05/27/1945  Date of referral:  11/27/16               Reason for consult:  Facility Placement, Discharge Planning                Permission sought to share information with:  Facility Industrial/product designerContact Representative Permission granted to share information::  Yes, Verbal Permission Granted  Name::        Agency::     Relationship::     Contact Information:     Housing/Transportation Living arrangements for the past 2 months:  Assisted Living Facility Source of Information:  Spouse, Medical Team Patient Interpreter Needed:  None Criminal Activity/Legal Involvement Pertinent to Current Situation/Hospitalization:  No - Comment as needed Significant Relationships:  Spouse, Adult Children Lives with:  Facility Resident Do you feel safe going back to the place where you live?  Yes Need for family participation in patient care:  Yes (Comment) (Patient has dementia and is only oriented to self.)  Care giving concerns:  Patient admitted from [facility]   Social Worker assessment / plan:  CSW attempted to meet with family at bedside with no success. According to the MD, the patient's family is in agreement with the patient returning to the [facility] tomorrow with family transportation. The CSW will follow with discharge facilitation.  Employment status:  Retired Health and safety inspectornsurance information:  Medicare PT Recommendations:  Home with Home Health Information / Referral to community resources:     Patient/Family's Response to care:  The patient has dementia and is only oriented to self.  Patient/Family's Understanding of and Emotional Response to Diagnosis, Current Treatment, and Prognosis:  The family is aware of the patient's prognosis and are in agreement with discharge on Monday.  Emotional Assessment Appearance:  Appears stated age Attitude/Demeanor/Rapport:  Lethargic Affect (typically observed):   Appropriate Orientation:  Oriented to Self Alcohol / Substance use:  Never Used Psych involvement (Current and /or in the community):  No (Comment)  Discharge Needs  Concerns to be addressed:  Care Coordination, Discharge Planning Concerns Readmission within the last 30 days:  Yes Current discharge risk:  None Barriers to Discharge:  Continued Medical Work up   [employer] CorporationKaren M [name], LCSW 11/27/2016, 1:57 PM

## 2016-11-27 NOTE — Progress Notes (Signed)
Pharmacy Antibiotic Note  Jessica NephewLinda Webster is a 71 y.o. female admitted on 11/23/2016 with PNA.  Pharmacy has been consulted for vancomycin dosing.  Patient from [facility].   Plan: Continue Vancomycin 1.25 g IV q18 hours. Vancomycin Trough level ordered to be drawn @ 1130 on 8/4. MRSA PCR pending   Continue Cefepime 1 g q8 hours.   8/5:  Vancomycin trough= 9 mcg/ml.  Will adjust Vancomycin to 750 mg IV Q12h. Will check trough prior to 4th dose of new regimen.  MRSA PCR positive.    Height: 5\' 7"  (170.2 cm) Weight: 151 lb 14.4 oz (68.9 kg) IBW/kg (Calculated) : 61.6  Temp (24hrs), Avg:99.2 F (37.3 C), Min:99.2 F (37.3 C), Max:99.2 F (37.3 C)   Recent Labs Lab 11/23/16 1758 11/25/16 0431 11/26/16 0556 11/27/16 0825  WBC 12.7* 9.9  --   --   CREATININE 0.61 0.70 0.72  --   LATICACIDVEN 1.1  --   --   --   VANCOTROUGH  --   --   --  9*    Estimated Creatinine Clearance: 62.7 mL/min (by C-G formula based on SCr of 0.72 mg/dL).    Allergies  Allergen Reactions  . Erythromycin Hives  . Sulfa Antibiotics Hives  . Vancomycin Rash    Noted redness at IV site during infusion.     Thank you for allowing pharmacy to be a part of this patient's care.  Swade Shonka A, Pharm.D., BCPS Clinical Pharmacist 11/27/2016 9:26 AM

## 2016-11-27 NOTE — NC FL2 (Signed)
[place] MEDICAID FL2 LEVEL OF CARE SCREENING TOOL     IDENTIFICATION  Patient Name: Jessica Webster Birthdate: 01/19/1946 Sex: female Admission Date (Current Location): 11/23/2016  [place]   Facility and Address:  [facility], [address], [place], [place] [address]      Provider Number: [id number]  Attending Physician Name and Address:  Altamese DillingVachhani, Vaibhavkumar, *  Relative Name and Phone Number:       Current Level of Care: Hospital Recommended Level of Care: Memory Care Prior Approval Number:    Date Approved/Denied:   PASRR Number:    Discharge Plan: Domiciliary (Rest home)    Current Diagnoses: Patient Active Problem List   Diagnosis Date Noted  . Community acquired pneumonia of right lower lobe of lung (HCC)   . Goals of care, counseling/discussion   . Palliative care encounter   . Altered mental status 11/23/2016  . Seizure (HCC) 11/01/2016    Orientation RESPIRATION BLADDER Height & Weight     Self  Normal Incontinent Weight: 151 lb 14.4 oz (68.9 kg) Height:  5\' 7"  (170.2 cm)  BEHAVIORAL SYMPTOMS/MOOD NEUROLOGICAL BOWEL NUTRITION STATUS      Incontinent    AMBULATORY STATUS COMMUNICATION OF NEEDS Skin   Independent Verbally Normal                       Personal Care Assistance Level of Assistance    Bathing Assistance: Limited assistance Feeding assistance: Independent Dressing Assistance: Limited assistance     Functional Limitations Info    Sight Info: Adequate Hearing Info: Adequate Speech Info: Adequate    SPECIAL CARE FACTORS FREQUENCY                       Contractures Contractures Info: Not present    Additional Factors Info  Code Status Code Status Info: DNR Allergies Info:  Erythromycin, Sulfa Antibiotics, Vancomycin Psychotropic Info: Depakote, Zoloft, Xanax, Risperdal         Medication List     TAKE these medications   ALPRAZolam 0.25 MG  tablet Commonly known as:  XANAX Take 1 tablet (0.25 mg total) by mouth 2 (two) times daily.   arformoterol 15 MCG/2ML Nebu Commonly known as:  BROVANA Take 2 mLs (15 mcg total) by nebulization 2 (two) times daily.   aspirin EC 81 MG tablet Take 81 mg by mouth daily.   atorvastatin 20 MG tablet Commonly known as:  LIPITOR Take 20 mg by mouth at bedtime.   budesonide 0.5 MG/2ML nebulizer solution Commonly known as:  PULMICORT Take 2 mLs (0.5 mg total) by nebulization 2 (two) times daily.   buPROPion 150 MG 24 hr tablet Commonly known as:  WELLBUTRIN XL Take 150 mg by mouth daily.   cefUROXime 500 MG tablet Commonly known as:  CEFTIN Take 1 tablet (500 mg total) by mouth 2 (two) times daily with a meal. X 4 more days   divalproex 250 MG DR tablet Commonly known as:  DEPAKOTE Take 1 tablet (250 mg total) by mouth every 12 (twelve) hours.   donepezil 10 MG tablet Commonly known as:  ARICEPT Take 10 mg by mouth every morning.   ferrous sulfate 325 (65 FE) MG tablet Take 325 mg by mouth daily with breakfast.   fluticasone 50 MCG/ACT nasal spray Commonly known as:  FLONASE Place 2 sprays into both nostrils daily.   furosemide 20 MG tablet Commonly known as:  LASIX  Take 20 mg by mouth.   ipratropium-albuterol 0.5-2.5 (3) MG/3ML Soln Commonly known as:  DUONEB Inhale 3 mLs into the lungs every 4 (four) hours as needed.   loperamide 2 MG capsule Commonly known as:  IMODIUM Take 2 mg by mouth as needed for diarrhea or loose stools.   memantine 10 MG tablet Commonly known as:  NAMENDA Take 10 mg by mouth 2 (two) times daily.   montelukast 10 MG tablet Commonly known as:  SINGULAIR Take 10 mg by mouth every morning.   nortriptyline 10 MG capsule Commonly known as:  PAMELOR Take 20 mg by mouth at bedtime.   omeprazole 20 MG capsule Commonly known as:  PRILOSEC Take 20 mg by mouth daily.   polyethylene glycol packet Commonly known as:  MIRALAX  / GLYCOLAX Take 17 g by mouth daily as needed.   potassium chloride SA 20 MEQ tablet Commonly known as:  K-DUR,KLOR-CON Take 1 tablet (20 mEq total) by mouth daily. While on lasix   predniSONE 5 MG tablet Commonly known as:  DELTASONE Take 1 tablet (5 mg total) by mouth daily with breakfast.   risperiDONE 0.5 MG tablet Commonly known as:  RISPERDAL Take 0.5 mg by mouth 2 (two) times daily.   saccharomyces boulardii 250 MG capsule Commonly known as:  FLORASTOR Take 250 mg by mouth 2 (two) times daily.   sertraline 100 MG tablet Commonly known as:  ZOLOFT Take 100 mg by mouth at bedtime.   Discharge Medications: Please see discharge summary for a list of discharge medications.  Relevant Imaging Results:  Relevant Lab Results:   Additional Information SS#  [id number]  Judi CongKaren M White, LCSW

## 2016-11-27 NOTE — Progress Notes (Signed)
[facility] - [place] at [facility]   PATIENT NAME: Rock NephewLinda Hoeppner    MR#:  [id number]  DATE OF BIRTH:  Jan 20, 1946  SUBJECTIVE:  CHIEF COMPLAINT:   Chief Complaint  Patient presents with  . Fever  . Hypertension  . Altered Mental Status  alzheimer's, lives in memory care- brought after having more confusion, not eating well. Found to have pneumonia.  More alert today, but confused. Daughter and husband in room said, now she is feeding good with assistance.  REVIEW OF SYSTEMS:  Pt is confused , not giving ROS>  ROS  DRUG ALLERGIES:   Allergies  Allergen Reactions  . Erythromycin Hives  . Sulfa Antibiotics Hives  . Vancomycin Rash    Noted redness at IV site during infusion.     VITALS:  Blood pressure 120/76, pulse 83, temperature 99.2 F (37.3 C), temperature source Oral, resp. rate 18, height 5\' 7"  (1.702 m), weight 68.9 kg (151 lb 14.4 oz), SpO2 94 %.  PHYSICAL EXAMINATION:  GENERAL:  71 y.o.-year-old patient lying in the bed with no acute distress.  EYES: Pupils equal, round, reactive to light and accommodation. No scleral icterus.   HEENT: Head atraumatic, normocephalic. Oropharynx and nasopharynx clear.  NECK:  Supple, no jugular venous distention. No thyroid enlargement, no tenderness.  LUNGS: Normal breath sounds bilaterally, no wheezing, some crepitation. No use of accessory muscles of respiration.  CARDIOVASCULAR: S1, S2 normal. No murmurs, rubs, or gallops.  ABDOMEN: Soft, nontender, nondistended. Bowel sounds present. No organomegaly or mass.  EXTREMITIES: No pedal edema, cyanosis, or clubbing.  NEUROLOGIC: pt is confused, awake, not much following commands. Moves limbs spontaneously. PSYCHIATRIC: The patient is confused and in and out of drowsiness.  SKIN: No obvious rash, lesion, or ulcer.   Physical Exam LABORATORY PANEL:   CBC  Recent Labs Lab 11/25/16 0431  WBC 9.9  HGB 11.0*  HCT 34.0*  PLT 185    ------------------------------------------------------------------------------------------------------------------  Chemistries   Recent Labs Lab 11/23/16 1758  11/26/16 0556  NA 138  < > 140  K 3.5  < > 3.6  CL 103  < > 104  CO2 25  < > 27  GLUCOSE 109*  < > 105*  BUN 14  < > 19  CREATININE 0.61  < > 0.72  CALCIUM 9.1  < > 8.7*  AST 20  --   --   ALT 17  --   --   ALKPHOS 100  --   --   BILITOT 0.6  --   --   < > = values in this interval not displayed. ------------------------------------------------------------------------------------------------------------------  Cardiac Enzymes  Recent Labs Lab 11/23/16 1758  TROPONINI <0.03   ------------------------------------------------------------------------------------------------------------------  RADIOLOGY:  No results found.  ASSESSMENT AND PLAN:   Active Problems:   Altered mental status   Community acquired pneumonia of right lower lobe of lung (HCC)   Goals of care, counseling/discussion   Palliative care encounter  * Acute metabolic encephalopathy - Likely due to infection.  Underlying severe dementia -  valproic acid level low. - Consulted neurology, no further work ups.  *  pneumonia: Seen on chest x-ray, also had low-grade fever, and leukocytosis - Hospital/healthcare acquired as she leaves at [facility] - Cover with empiric antibiotics - blood cx negative - she has improvement in condition today, though procalcitonin is negative, I will treat total for 5 days for Pneumonia. ( which is until 11/28/16)  * Dementia with behavioral disturbance and anxiety.  Depakote should also help out with mood stabilization. Continue Zoloft, nortriptyline and Risperidal.  * Asthma. Respiratory status stable on nebulizer treatments. On chronic prednisone for chronic bronchitis  * Hyperlipidemia unspecified on atorvastatin  * Essential hypertension - Continue Lasix and monitor blood  pressure   All the records are reviewed and case discussed with Care Management/Social Workerr. Management plans discussed with the patient, family and they are in agreement.  CODE STATUS: DNR  TOTAL TIME TAKING CARE OF THIS PATIENT: 35 minutes.   She have some improvement in mental status today, discussed with her husband, will cont Abx and monitor, if she improves much- can go back to her facility.  As per husband, over her facility over the weekend they are not able to get more medicines and more helpful for patient and she would need a lot of help right now including feeding and complete care for her so we prefer to send her tomorrow.  POSSIBLE D/C IN 1-2 DAYS, DEPENDING ON CLINICAL CONDITION.   Altamese DillingVACHHANI, Zorana Brockwell M.D on 11/27/2016   Between 7am to 6pm - Pager - [phone]  After 6pm go to [email or web address] - password [email or web address] [email or web address]  [facility] [place] [facility]  Office  [phone]  CC: Primary care physician; Clovis PuVan Horn, Arturo MortonJill E, DO  Note: This dictation was prepared with Dragon dictation along with smaller phrase technology. Any transcriptional errors that result from this process are unintentional.

## 2016-11-28 LAB — CULTURE, BLOOD (ROUTINE X 2)
CULTURE: NO GROWTH
CULTURE: NO GROWTH
SPECIAL REQUESTS: ADEQUATE
Special Requests: ADEQUATE

## 2016-11-28 LAB — CREATININE, SERUM
Creatinine, Ser: 0.79 mg/dL (ref 0.44–1.00)
GFR calc Af Amer: >60 mL/min (ref >60)
GFR calc non Af Amer: >60 mL/min (ref >60)

## 2016-11-28 MED ORDER — POTASSIUM CHLORIDE CRYS ER 20 MEQ PO TBCR: 20.0000 meq | EXTENDED_RELEASE_TABLET | Freq: Every day | ORAL | 2 refills | Status: AC

## 2016-11-28 MED ORDER — CEFUROXIME AXETIL 500 MG PO TABS: 500.0000 mg | ORAL_TABLET | Freq: Two times a day (BID) | ORAL | 0 refills | Status: DC

## 2016-11-28 NOTE — Clinical Social Work Note (Signed)
Pt is ready for discharge and is ready to return to [facility]. Facility is ready to accept pt. Pt's husband is aware and agreeable to discharge plan. He will also provide transportation. Packet has been provided. RN is aware. CSW is signing off as no further needs identified.   Dede QuerySarah Willy Vorce, MSW, LCSW  Clinical Social Worker  [phone]

## 2016-11-28 NOTE — Discharge Summary (Signed)
[facility] - [place] at [facility]   PATIENT NAME: Jessica Webster    MR#:  [id number]  DATE OF BIRTH:  December 16, 1945  DATE OF ADMISSION:  11/23/2016   ADMITTING PHYSICIAN: Delfino Lovett, MD  DATE OF DISCHARGE:  11/28/2016  PRIMARY CARE PHYSICIAN: Clovis Pu, Arturo Morton, DO   ADMISSION DIAGNOSIS:   Fever [R50.9] Community acquired pneumonia of right lower lobe of lung (HCC) [J18.1]  DISCHARGE DIAGNOSIS:   Active Problems:   Altered mental status   Community acquired pneumonia of right lower lobe of lung (HCC)   Goals of care, counseling/discussion   Palliative care encounter   SECONDARY DIAGNOSIS:   Past Medical History:  Diagnosis Date  . Alzheimer disease   . Anxiety   . Asthma   . Hypertension     HOSPITAL COURSE:   71 year old female with past medical history significant for Alzheimer's dementia, seizure disorder, GERD, asthma was brought in secondary to confusion and decreased oral intake from [facility].  #1 altered mental status-metabolic encephalopathy secondary to underlying pneumonia and also progressively worsening dementia. -CT of the head not done this admission, had a CT head last month which shows diffuse cortical atrophy and chronic small vessel ischemic changes.Marland Kitchen Appreciate neurology consult. -According to husband who is at bedside, mental status has been slowly declining since her seizure episode last month. -On valproic acid for seizure, though levels are low-plan was decided to continue at the same dose. Follows with outpatient neurologist for the same  #2 right lower lobe pneumonia-minimal infiltrate on chest x-ray -Treated with vancomycin and Fortaz in the hospital. Blood cultures remain negative -Being discharged on Ceftin  #3 dementia-aggressively worsening. Continue Aricept and Namenda Risperidone added for behavioral changes  #4 depression and anxiety-continue Wellbutrin,, Zoloft and Xanax  #5 chronic  bronchitis and asthma-and tinea home medications. On low dose chronic prednisone  Patient will be discharged back to [facility] unit today   DISCHARGE CONDITIONS:   Guarded  CONSULTS OBTAINED:   Treatment Team:  Thana Farr, MD  DRUG ALLERGIES:   Allergies  Allergen Reactions  . Erythromycin Hives  . Sulfa Antibiotics Hives  . Vancomycin Rash    Noted redness at IV site during infusion.    DISCHARGE MEDICATIONS:   Allergies as of 11/28/2016      Reactions   Erythromycin Hives   Sulfa Antibiotics Hives   Vancomycin Rash   Noted redness at IV site during infusion.       Medication List    TAKE these medications   ALPRAZolam 0.25 MG tablet Commonly known as:  XANAX Take 1 tablet (0.25 mg total) by mouth 2 (two) times daily.   arformoterol 15 MCG/2ML Nebu Commonly known as:  BROVANA Take 2 mLs (15 mcg total) by nebulization 2 (two) times daily.   aspirin EC 81 MG tablet Take 81 mg by mouth daily.   atorvastatin 20 MG tablet Commonly known as:  LIPITOR Take 20 mg by mouth at bedtime.   budesonide 0.5 MG/2ML nebulizer solution Commonly known as:  PULMICORT Take 2 mLs (0.5 mg total) by nebulization 2 (two) times daily.   buPROPion 150 MG 24 hr tablet Commonly known as:  WELLBUTRIN XL Take 150 mg by mouth daily.   cefUROXime 500 MG tablet Commonly known as:  CEFTIN Take 1 tablet (500 mg total) by mouth 2 (two) times daily with a meal. X 4 more days   divalproex 250 MG DR tablet Commonly known  as:  DEPAKOTE Take 1 tablet (250 mg total) by mouth every 12 (twelve) hours.   donepezil 10 MG tablet Commonly known as:  ARICEPT Take 10 mg by mouth every morning.   ferrous sulfate 325 (65 FE) MG tablet Take 325 mg by mouth daily with breakfast.   fluticasone 50 MCG/ACT nasal spray Commonly known as:  FLONASE Place 2 sprays into both nostrils daily.   furosemide 20 MG tablet Commonly known as:  LASIX Take 20 mg by mouth.     ipratropium-albuterol 0.5-2.5 (3) MG/3ML Soln Commonly known as:  DUONEB Inhale 3 mLs into the lungs every 4 (four) hours as needed.   loperamide 2 MG capsule Commonly known as:  IMODIUM Take 2 mg by mouth as needed for diarrhea or loose stools.   memantine 10 MG tablet Commonly known as:  NAMENDA Take 10 mg by mouth 2 (two) times daily.   montelukast 10 MG tablet Commonly known as:  SINGULAIR Take 10 mg by mouth every morning.   nortriptyline 10 MG capsule Commonly known as:  PAMELOR Take 20 mg by mouth at bedtime.   omeprazole 20 MG capsule Commonly known as:  PRILOSEC Take 20 mg by mouth daily.   polyethylene glycol packet Commonly known as:  MIRALAX / GLYCOLAX Take 17 g by mouth daily as needed.   potassium chloride SA 20 MEQ tablet Commonly known as:  K-DUR,KLOR-CON Take 1 tablet (20 mEq total) by mouth daily. While on lasix   predniSONE 5 MG tablet Commonly known as:  DELTASONE Take 1 tablet (5 mg total) by mouth daily with breakfast.   risperiDONE 0.5 MG tablet Commonly known as:  RISPERDAL Take 0.5 mg by mouth 2 (two) times daily.   saccharomyces boulardii 250 MG capsule Commonly known as:  FLORASTOR Take 250 mg by mouth 2 (two) times daily.   sertraline 100 MG tablet Commonly known as:  ZOLOFT Take 100 mg by mouth at bedtime.        DISCHARGE INSTRUCTIONS:   1. PCP f/u in 1-2 weeks 2. Neurology f/u in 2-3 weeks  DIET:   Cardiac diet  ACTIVITY:   Activity as tolerated  OXYGEN:   Home Oxygen: No.  Oxygen Delivery: room air  DISCHARGE LOCATION:   Assisted Living Facility- memory care unit   If you experience worsening of your admission symptoms, develop shortness of breath, life threatening emergency, suicidal or homicidal thoughts you must seek medical attention immediately by calling 911 or calling your MD immediately  if symptoms less severe.  You Must read complete instructions/literature along with all the possible adverse  reactions/side effects for all the Medicines you take and that have been prescribed to you. Take any new Medicines after you have completely understood and accpet all the possible adverse reactions/side effects.   Please note  You were cared for by a hospitalist during your hospital stay. If you have any questions about your discharge medications or the care you received while you were in the hospital after you are discharged, you can call the unit and asked to speak with the hospitalist on call if the hospitalist that took care of you is not available. Once you are discharged, your primary care physician will handle any further medical issues. Please note that NO REFILLS for any discharge medications will be authorized once you are discharged, as it is imperative that you return to your primary care physician (or establish a relationship with a primary care physician if you do not have one) for  your aftercare needs so that they can reassess your need for medications and monitor your lab values.    On the day of Discharge:  VITAL SIGNS:   Blood pressure 117/69, pulse 74, temperature 97.8 F (36.6 C), temperature source Axillary, resp. rate 20, height 5\' 7"  (1.702 m), weight 68.9 kg (151 lb 14.4 oz), SpO2 97 %.  PHYSICAL EXAMINATION:    GENERAL:  71 y.o.-year-old patient lying in the bed with no acute distress.  EYES: Pupils equal, round, reactive to light and accommodation. No scleral icterus. Extraocular muscles intact.  HEENT: Head atraumatic, normocephalic. Oropharynx and nasopharynx clear.  NECK:  Supple, no jugular venous distention. No thyroid enlargement, no tenderness.  LUNGS: Normal breath sounds bilaterally, no wheezing, rales,rhonchi or crepitation. No use of accessory muscles of respiration. Decreased bibasilar breath sounds CARDIOVASCULAR: S1, S2 normal. No murmurs, rubs, or gallops.  ABDOMEN: Soft, non-tender, non-distended. Bowel sounds present. No organomegaly or mass.    EXTREMITIES: No pedal edema, cyanosis, or clubbing.  NEUROLOGIC: Cranial nerves II through XII are intact. No focal motor deficits, though weak in the lower extremities than upper extremities. Sensation intact. Gait not checked. Follow simple commands. PSYCHIATRIC: The patient is alert and oriented to self SKIN: No obvious rash, lesion, or ulcer.   DATA REVIEW:   CBC  Recent Labs Lab 11/25/16 0431  WBC 9.9  HGB 11.0*  HCT 34.0*  PLT 185    Chemistries   Recent Labs Lab 11/23/16 1758  11/26/16 0556 11/28/16 0429  NA 138  < > 140  --   K 3.5  < > 3.6  --   CL 103  < > 104  --   CO2 25  < > 27  --   GLUCOSE 109*  < > 105*  --   BUN 14  < > 19  --   CREATININE 0.61  < > 0.72 0.79  CALCIUM 9.1  < > 8.7*  --   AST 20  --   --   --   ALT 17  --   --   --   ALKPHOS 100  --   --   --   BILITOT 0.6  --   --   --   < > = values in this interval not displayed.   Microbiology Results  Results for orders placed or performed during the hospital encounter of 11/23/16  Blood Culture (routine x 2)     Status: None   Collection Time: 11/23/16  5:58 PM  Result Value Ref Range Status   Specimen Description BLOOD BLOOD LEFT HAND  Final   Special Requests   Final    BOTTLES DRAWN AEROBIC AND ANAEROBIC Blood Culture adequate volume   Culture NO GROWTH 5 DAYS  Final   Report Status 11/28/2016 FINAL  Final  Urine culture     Status: Abnormal   Collection Time: 11/23/16  5:58 PM  Result Value Ref Range Status   Specimen Description URINE, RANDOM  Final   Special Requests NONE  Final   Culture (A)  Final    <10,000 COLONIES/mL INSIGNIFICANT GROWTH Performed at [facility], [address] [place], [place] [address]    Report Status 11/25/2016 FINAL  Final  Blood Culture (routine x 2)     Status: None   Collection Time: 11/23/16  7:08 PM  Result Value Ref Range Status   Specimen Description BLOOD BLOOD RIGHT HAND  Final   Special Requests   Final  BOTTLES DRAWN AEROBIC  AND ANAEROBIC Blood Culture adequate volume   Culture NO GROWTH 5 DAYS  Final   Report Status 11/28/2016 FINAL  Final  MRSA PCR Screening     Status: Abnormal   Collection Time: 11/26/16  9:31 AM  Result Value Ref Range Status   MRSA by PCR POSITIVE (A) NEGATIVE Final    Comment:        The GeneXpert MRSA Assay (FDA approved for NASAL specimens only), is one component of a comprehensive MRSA colonization surveillance program. It is not intended to diagnose MRSA infection nor to guide or monitor treatment for MRSA infections. RESULT CALLED TO, READ BACK BY AND VERIFIED WITH: Orson ApeDanielle Jacobs @ [id number] 11/26/16 by [facility]     RADIOLOGY:  No results found.   Management plans discussed with the patient, family and they are in agreement.  CODE STATUS:     Code Status Orders        Start     Ordered   11/23/16 2322  Do not attempt resuscitation (DNR)  Continuous    Question Answer Comment  In the event of cardiac or respiratory ARREST Do not call a "code blue"   In the event of cardiac or respiratory ARREST Do not perform Intubation, CPR, defibrillation or ACLS   In the event of cardiac or respiratory ARREST Use medication by any route, position, wound care, and other measures to relive pain and suffering. May use oxygen, suction and manual treatment of airway obstruction as needed for comfort.      11/23/16 2321    Code Status History    Date Active Date Inactive Code Status Order ID Comments User Context   11/23/2016 10:50 PM 11/23/2016 11:21 PM DNR [id number]  Delfino LovettShah, Vipul, MD ED   11/01/2016  3:29 PM 11/04/2016  7:00 PM DNR [id number]  Milagros LollSudini, Srikar, MD ED    Advance Directive Documentation     Most Recent Value  Type of Advance Directive  Living will  Pre-existing out of facility DNR order (yellow form or pink MOST form)  Yellow form placed in chart (order not valid for inpatient use)  "MOST" Form in Place?  -      TOTAL TIME TAKING CARE OF THIS PATIENT: 37 minutes.     Hassie Mandt M.D on 11/28/2016 at 9:55 AM  Between 7am to 6pm - Pager - [phone]  After 6pm go to [email or web address] - password [email or web address]  [facility] [place] [facility]  Office  [phone]  CC: Primary care physician; Clovis PuVan Horn, Arturo MortonJill E, DO   Note: This dictation was prepared with Dragon dictation along with smaller phrase technology. Any transcriptional errors that result from this process are unintentional.

## 2016-11-28 NOTE — Care Management Important Message (Signed)
Important Message  Patient Details  Name: Rock NephewLinda Gallier MRN: [id number] Date of Birth: 1945-07-18   Medicare Important Message Given:  Yes    Gwenette GreetBrenda S Lyrick Worland, RN 11/28/2016, 9:04 AM

## 2017-01-05 ENCOUNTER — Inpatient Hospital Stay
Admission: EM | Admit: 2017-01-05 | Discharge: 2017-01-08 | DRG: 177 | Disposition: A | Discharge status: Skilled Nursing Facility | Payer: Medicare Other | Attending: Specialist | Admitting: Specialist

## 2017-01-05 ENCOUNTER — Inpatient Hospital Stay: Payer: Medicare Other

## 2017-01-05 ENCOUNTER — Emergency Department: Payer: Medicare Other

## 2017-01-05 DIAGNOSIS — Z87891 Personal history of nicotine dependence: Secondary | ICD-10-CM

## 2017-01-05 DIAGNOSIS — I10 Essential (primary) hypertension: Secondary | ICD-10-CM | POA: Diagnosis present

## 2017-01-05 DIAGNOSIS — Z79899 Other long term (current) drug therapy: Secondary | ICD-10-CM

## 2017-01-05 DIAGNOSIS — Z7982 Long term (current) use of aspirin: Secondary | ICD-10-CM

## 2017-01-05 DIAGNOSIS — Z66 Do not resuscitate: Secondary | ICD-10-CM | POA: Diagnosis present

## 2017-01-05 DIAGNOSIS — J690 Pneumonitis due to inhalation of food and vomit: Principal | ICD-10-CM | POA: Diagnosis present

## 2017-01-05 DIAGNOSIS — K219 Gastro-esophageal reflux disease without esophagitis: Secondary | ICD-10-CM | POA: Diagnosis present

## 2017-01-05 DIAGNOSIS — F419 Anxiety disorder, unspecified: Secondary | ICD-10-CM | POA: Diagnosis present

## 2017-01-05 DIAGNOSIS — J9601 Acute respiratory failure with hypoxia: Secondary | ICD-10-CM | POA: Diagnosis present

## 2017-01-05 DIAGNOSIS — F0281 Dementia in other diseases classified elsewhere with behavioral disturbance: Secondary | ICD-10-CM | POA: Diagnosis present

## 2017-01-05 DIAGNOSIS — R1310 Dysphagia, unspecified: Secondary | ICD-10-CM | POA: Diagnosis present

## 2017-01-05 DIAGNOSIS — G309 Alzheimer's disease, unspecified: Secondary | ICD-10-CM | POA: Diagnosis present

## 2017-01-05 DIAGNOSIS — T17908A Unspecified foreign body in respiratory tract, part unspecified causing other injury, initial encounter: Secondary | ICD-10-CM

## 2017-01-05 DIAGNOSIS — E785 Hyperlipidemia, unspecified: Secondary | ICD-10-CM | POA: Diagnosis present

## 2017-01-05 DIAGNOSIS — J441 Chronic obstructive pulmonary disease with (acute) exacerbation: Secondary | ICD-10-CM | POA: Diagnosis present

## 2017-01-05 DIAGNOSIS — F329 Major depressive disorder, single episode, unspecified: Secondary | ICD-10-CM | POA: Diagnosis present

## 2017-01-05 LAB — BLOOD GAS, ARTERIAL
ACID-BASE EXCESS: 2.6 mmol/L — AB (ref 0.0–2.0)
BICARBONATE: 27.2 mmol/L (ref 20.0–28.0)
Delivery systems: POSITIVE
EXPIRATORY PAP: 5
FIO2: 0.25
Inspiratory PAP: 10
O2 SAT: 97.1 %
PH ART: 7.43 (ref 7.350–7.450)
Patient temperature: 37
pCO2 arterial: 41 mmHg (ref 32.0–48.0)
pO2, Arterial: 89 mmHg (ref 83.0–108.0)

## 2017-01-05 LAB — CBC WITH DIFFERENTIAL/PLATELET
Basophils Absolute: 0 10*3/uL (ref 0–0.1)
Basophils Relative: 0 %
EOS PCT: 17 %
Eosinophils Absolute: 1.4 10*3/uL — ABNORMAL HIGH (ref 0–0.7)
HCT: 42.2 % (ref 35.0–47.0)
Hemoglobin: 13.8 g/dL (ref 12.0–16.0)
LYMPHS ABS: 1.5 10*3/uL (ref 1.0–3.6)
LYMPHS PCT: 18 %
MCH: 26.1 pg (ref 26.0–34.0)
MCHC: 32.8 g/dL (ref 32.0–36.0)
MCV: 79.5 fL — AB (ref 80.0–100.0)
MONO ABS: 0.9 10*3/uL (ref 0.2–0.9)
MONOS PCT: 11 %
Neutro Abs: 4.5 10*3/uL (ref 1.4–6.5)
Neutrophils Relative %: 54 %
PLATELETS: 301 10*3/uL (ref 150–440)
RBC: 5.3 MIL/uL — ABNORMAL HIGH (ref 3.80–5.20)
RDW: 24.2 % — AB (ref 11.5–14.5)
WBC: 8.4 10*3/uL (ref 3.6–11.0)

## 2017-01-05 LAB — COMPREHENSIVE METABOLIC PANEL
ALT: 20 U/L (ref 14–54)
AST: 27 U/L (ref 15–41)
Albumin: 3.5 g/dL (ref 3.5–5.0)
Alkaline Phosphatase: 76 U/L (ref 38–126)
Anion gap: 10 (ref 5–15)
BILIRUBIN TOTAL: 0.4 mg/dL (ref 0.3–1.2)
BUN: 14 mg/dL (ref 6–20)
CHLORIDE: 104 mmol/L (ref 101–111)
CO2: 26 mmol/L (ref 22–32)
Calcium: 8.6 mg/dL — ABNORMAL LOW (ref 8.9–10.3)
Creatinine, Ser: 0.75 mg/dL (ref 0.44–1.00)
GFR calc Af Amer: >60 mL/min (ref >60)
Glucose, Bld: 105 mg/dL — ABNORMAL HIGH (ref 65–99)
POTASSIUM: 4.3 mmol/L (ref 3.5–5.1)
Sodium: 140 mmol/L (ref 135–145)
Total Protein: 6.9 g/dL (ref 6.5–8.1)

## 2017-01-05 LAB — TROPONIN I: Troponin I: <0.03 ng/mL (ref <0.03)

## 2017-01-05 LAB — LACTIC ACID, PLASMA: Lactic Acid, Venous: 1.9 mmol/L (ref 0.5–1.9)

## 2017-01-05 MED ORDER — MAGNESIUM SULFATE 2 GM/50ML IV SOLN: 2.0000 g | Freq: Once | INTRAVENOUS | Status: DC

## 2017-01-05 MED ORDER — ATORVASTATIN CALCIUM 20 MG PO TABS
20.0000 mg | ORAL_TABLET | Freq: Every day | ORAL | Status: DC
  Administered 2017-01-05 – 2017-01-07 (×3): 20 mg via ORAL
  Filled 2017-01-05 (×3): qty 1

## 2017-01-05 MED ORDER — POLYETHYLENE GLYCOL 3350 17 G PO PACK: 17.0000 g | PACK | Freq: Every day | ORAL | Status: DC | PRN

## 2017-01-05 MED ORDER — DIVALPROEX SODIUM 250 MG PO DR TAB
250.0000 mg | DELAYED_RELEASE_TABLET | Freq: Two times a day (BID) | ORAL | Status: DC
  Administered 2017-01-05 – 2017-01-06 (×2): 250 mg via ORAL
  Filled 2017-01-05 (×3): qty 1

## 2017-01-05 MED ORDER — SACCHAROMYCES BOULARDII 250 MG PO CAPS
250.0000 mg | ORAL_CAPSULE | Freq: Two times a day (BID) | ORAL | Status: DC
  Administered 2017-01-05 – 2017-01-08 (×6): 250 mg via ORAL
  Filled 2017-01-05 (×7): qty 1

## 2017-01-05 MED ORDER — SODIUM CHLORIDE 0.9 % IV SOLN
INTRAVENOUS | Status: DC
  Administered 2017-01-05 – 2017-01-06 (×3): via INTRAVENOUS

## 2017-01-05 MED ORDER — METHYLPREDNISOLONE SODIUM SUCC 125 MG IJ SOLR
125.0000 mg | Freq: Once | INTRAMUSCULAR | Status: AC
  Administered 2017-01-05: 125 mg via INTRAVENOUS
  Filled 2017-01-05: qty 2

## 2017-01-05 MED ORDER — LORAZEPAM 2 MG/ML IJ SOLN
INTRAMUSCULAR | Status: AC
  Filled 2017-01-05: qty 1

## 2017-01-05 MED ORDER — ENOXAPARIN SODIUM 40 MG/0.4ML ~~LOC~~ SOLN
40.0000 mg | SUBCUTANEOUS | Status: DC
  Administered 2017-01-05 – 2017-01-07 (×3): 40 mg via SUBCUTANEOUS
  Filled 2017-01-05 (×3): qty 0.4

## 2017-01-05 MED ORDER — ALBUTEROL SULFATE (2.5 MG/3ML) 0.083% IN NEBU
5.0000 mg | INHALATION_SOLUTION | Freq: Once | RESPIRATORY_TRACT | Status: AC
  Administered 2017-01-05: 2.5 mg via RESPIRATORY_TRACT

## 2017-01-05 MED ORDER — ASPIRIN EC 81 MG PO TBEC: 81.0000 mg | DELAYED_RELEASE_TABLET | Freq: Every day | ORAL | Status: DC

## 2017-01-05 MED ORDER — IPRATROPIUM-ALBUTEROL 0.5-2.5 (3) MG/3ML IN SOLN
3.0000 mL | Freq: Four times a day (QID) | RESPIRATORY_TRACT | Status: DC
  Administered 2017-01-05 – 2017-01-08 (×9): 3 mL via RESPIRATORY_TRACT
  Filled 2017-01-05 (×9): qty 3

## 2017-01-05 MED ORDER — ALPRAZOLAM 0.25 MG PO TABS
0.2500 mg | ORAL_TABLET | Freq: Two times a day (BID) | ORAL | Status: DC
  Administered 2017-01-05 – 2017-01-08 (×6): 0.25 mg via ORAL
  Filled 2017-01-05 (×6): qty 1

## 2017-01-05 MED ORDER — IPRATROPIUM-ALBUTEROL 0.5-2.5 (3) MG/3ML IN SOLN
RESPIRATORY_TRACT | Status: DC
Start: 2017-01-05 — End: 2017-01-05
  Filled 2017-01-05: qty 9

## 2017-01-05 MED ORDER — BUDESONIDE 0.5 MG/2ML IN SUSP
0.5000 mg | Freq: Two times a day (BID) | RESPIRATORY_TRACT | Status: DC
  Administered 2017-01-05 – 2017-01-08 (×6): 0.5 mg via RESPIRATORY_TRACT
  Filled 2017-01-05 (×6): qty 2

## 2017-01-05 MED ORDER — BUPROPION HCL ER (XL) 150 MG PO TB24
150.0000 mg | ORAL_TABLET | Freq: Every day | ORAL | Status: DC
  Administered 2017-01-08: 150 mg via ORAL
  Filled 2017-01-05 (×2): qty 1

## 2017-01-05 MED ORDER — METHYLPREDNISOLONE SODIUM SUCC 125 MG IJ SOLR
60.0000 mg | Freq: Three times a day (TID) | INTRAMUSCULAR | Status: DC
  Administered 2017-01-05 – 2017-01-06 (×3): 60 mg via INTRAVENOUS
  Filled 2017-01-05 (×3): qty 2

## 2017-01-05 MED ORDER — NORTRIPTYLINE HCL 10 MG PO CAPS
20.0000 mg | ORAL_CAPSULE | Freq: Every day | ORAL | Status: DC
  Administered 2017-01-05 – 2017-01-07 (×3): 20 mg via ORAL
  Filled 2017-01-05 (×4): qty 2

## 2017-01-05 MED ORDER — PANTOPRAZOLE SODIUM 40 MG PO TBEC
40.0000 mg | DELAYED_RELEASE_TABLET | Freq: Every day | ORAL | Status: DC
  Administered 2017-01-08: 40 mg via ORAL
  Filled 2017-01-05: qty 1

## 2017-01-05 MED ORDER — METHYLPREDNISOLONE SODIUM SUCC 125 MG IJ SOLR
INTRAMUSCULAR | Status: AC
  Filled 2017-01-05: qty 2

## 2017-01-05 MED ORDER — POTASSIUM CHLORIDE CRYS ER 20 MEQ PO TBCR
20.0000 meq | EXTENDED_RELEASE_TABLET | Freq: Every day | ORAL | Status: DC
  Administered 2017-01-07 – 2017-01-08 (×2): 20 meq via ORAL
  Filled 2017-01-05 (×2): qty 1

## 2017-01-05 MED ORDER — FLUTICASONE PROPIONATE 50 MCG/ACT NA SUSP
2.0000 | Freq: Every day | NASAL | Status: DC
  Filled 2017-01-05: qty 16

## 2017-01-05 MED ORDER — MONTELUKAST SODIUM 10 MG PO TABS
10.0000 mg | ORAL_TABLET | Freq: Every morning | ORAL | Status: DC
  Administered 2017-01-06 – 2017-01-08 (×3): 10 mg via ORAL
  Filled 2017-01-05 (×3): qty 1

## 2017-01-05 MED ORDER — LORAZEPAM 2 MG/ML IJ SOLN
0.2500 mg | Freq: Once | INTRAMUSCULAR | Status: AC
  Administered 2017-01-05: 0.25 mg via INTRAVENOUS

## 2017-01-05 MED ORDER — BUDESONIDE 0.5 MG/2ML IN SUSP: 0.5000 mg | Freq: Two times a day (BID) | RESPIRATORY_TRACT | Status: DC

## 2017-01-05 MED ORDER — ARFORMOTEROL TARTRATE 15 MCG/2ML IN NEBU
15.0000 ug | INHALATION_SOLUTION | Freq: Two times a day (BID) | RESPIRATORY_TRACT | Status: DC
  Administered 2017-01-05 – 2017-01-08 (×5): 15 ug via RESPIRATORY_TRACT
  Filled 2017-01-05 (×7): qty 2

## 2017-01-05 MED ORDER — ALBUTEROL SULFATE (2.5 MG/3ML) 0.083% IN NEBU
2.5000 mg | INHALATION_SOLUTION | RESPIRATORY_TRACT | Status: DC | PRN
  Administered 2017-01-05: 2.5 mg via RESPIRATORY_TRACT
  Filled 2017-01-05: qty 3

## 2017-01-05 MED ORDER — ALBUTEROL SULFATE (2.5 MG/3ML) 0.083% IN NEBU
2.5000 mg | INHALATION_SOLUTION | Freq: Once | RESPIRATORY_TRACT | Status: AC
  Administered 2017-01-05: 2.5 mg via RESPIRATORY_TRACT
  Filled 2017-01-05: qty 3

## 2017-01-05 MED ORDER — DONEPEZIL HCL 5 MG PO TABS
10.0000 mg | ORAL_TABLET | Freq: Every morning | ORAL | Status: DC
  Administered 2017-01-06 – 2017-01-08 (×3): 10 mg via ORAL
  Filled 2017-01-05 (×3): qty 2

## 2017-01-05 MED ORDER — ONDANSETRON HCL 4 MG PO TABS: 4.0000 mg | ORAL_TABLET | Freq: Four times a day (QID) | ORAL | Status: DC | PRN

## 2017-01-05 MED ORDER — SERTRALINE HCL 50 MG PO TABS
100.0000 mg | ORAL_TABLET | Freq: Every day | ORAL | Status: DC
  Administered 2017-01-05 – 2017-01-07 (×3): 100 mg via ORAL
  Filled 2017-01-05 (×3): qty 2

## 2017-01-05 MED ORDER — RISPERIDONE 0.5 MG PO TABS
0.5000 mg | ORAL_TABLET | Freq: Two times a day (BID) | ORAL | Status: DC
  Administered 2017-01-05 – 2017-01-08 (×6): 0.5 mg via ORAL
  Filled 2017-01-05 (×7): qty 1

## 2017-01-05 MED ORDER — ALBUTEROL SULFATE (2.5 MG/3ML) 0.083% IN NEBU
INHALATION_SOLUTION | RESPIRATORY_TRACT | Status: AC
  Administered 2017-01-05: 2.5 mg
  Filled 2017-01-05: qty 6

## 2017-01-05 MED ORDER — ONDANSETRON HCL 4 MG/2ML IJ SOLN: 4.0000 mg | Freq: Four times a day (QID) | INTRAMUSCULAR | Status: DC | PRN

## 2017-01-05 MED ORDER — SODIUM CHLORIDE 0.9 % IV SOLN
Freq: Once | INTRAVENOUS | Status: AC
  Administered 2017-01-05: 11:00:00 via INTRAVENOUS
  Filled 2017-01-05: qty 4

## 2017-01-05 MED ORDER — MEMANTINE HCL 5 MG PO TABS
10.0000 mg | ORAL_TABLET | Freq: Two times a day (BID) | ORAL | Status: DC
  Administered 2017-01-05 – 2017-01-08 (×6): 10 mg via ORAL
  Filled 2017-01-05 (×6): qty 2

## 2017-01-05 MED ORDER — FERROUS SULFATE 325 (65 FE) MG PO TABS
325.0000 mg | ORAL_TABLET | Freq: Every day | ORAL | Status: DC
  Administered 2017-01-08: 325 mg via ORAL
  Filled 2017-01-05 (×2): qty 1

## 2017-01-05 NOTE — ED Notes (Signed)
Pt asleep. Remains unlabored at this time. Husband at bedside.

## 2017-01-05 NOTE — ED Notes (Signed)
Pt resting comfortably on bipap. Does not appear labored at this time.  Family remains at bedside.  On cardiac monitor.

## 2017-01-05 NOTE — ED Triage Notes (Signed)
Sob and wheezing this AM upon awakening. Pt had breathing tx PTA.

## 2017-01-05 NOTE — ED Provider Notes (Signed)
[facility] Emergency Department Provider Note    None    (approximate)  I have reviewed the triage vital signs and the nursing notes.   HISTORY  Chief Complaint Shortness of Breath  Level V Caveat: Dementia  HPI Jessica Webster is a 71 y.o. female with severe dementia presents with chief complaint of severe shortness of breath. Patient arrives in the ER moderate respiratory distress with audible wheezing. Patient is able to speak in short sentences not able to provide any additional history. Amylase at bedside and states that she has had these episodes in the past and has responded well to BiPAP. Also has a significant component of anxiety there report but is very difficult to tell given her dementia. No other reports of any fevers or nausea or vomiting. Denies diarrhea. No complaint of chest pain. No recent antibiotics.    Past Medical History:  Diagnosis Date  . Alzheimer disease   . Anxiety   . Asthma   . Hypertension    Family History  Problem Relation Age of Onset  . Asthma Father   . Brain cancer Brother    Past Surgical History:  Procedure Laterality Date  . APPENDECTOMY    . NASAL SINUS SURGERY     Patient Active Problem List   Diagnosis Date Noted  . COPD with acute exacerbation (HCC) 01/05/2017  . Community acquired pneumonia of right lower lobe of lung (HCC)   . Goals of care, counseling/discussion   . Palliative care encounter   . Altered mental status 11/23/2016  . Seizure (HCC) 11/01/2016      Prior to Admission medications   Medication Sig Start Date End Date Taking? Authorizing Provider  ALPRAZolam (XANAX) 0.25 MG tablet Take 1 tablet (0.25 mg total) by mouth 2 (two) times daily. Patient taking differently: Take 0.25 mg by mouth 3 (three) times daily.  11/04/16  Yes Wieting, Richard, MD  arformoterol (BROVANA) 15 MCG/2ML NEBU Take 2 mLs (15 mcg total) by nebulization 2 (two) times daily. 08/02/16  Yes Merwyn Katos, MD    aspirin EC 81 MG tablet Take 81 mg by mouth daily.   Yes [provider]  atorvastatin (LIPITOR) 20 MG tablet Take 20 mg by mouth at bedtime.   Yes [provider]  budesonide (PULMICORT) 0.5 MG/2ML nebulizer solution Take 2 mLs (0.5 mg total) by nebulization 2 (two) times daily. 08/02/16 08/02/17 Yes Merwyn Katos, MD  buPROPion (WELLBUTRIN XL) 150 MG 24 hr tablet Take 150 mg by mouth daily. 11/09/16  Yes [provider]  divalproex (DEPAKOTE) 250 MG DR tablet Take 1 tablet (250 mg total) by mouth every 12 (twelve) hours. 11/04/16  Yes Wieting, Richard, MD  donepezil (ARICEPT) 10 MG tablet Take 10 mg by mouth every morning.    Yes [provider]  ferrous sulfate 325 (65 FE) MG tablet Take 325 mg by mouth daily with breakfast.   Yes [provider]  fluticasone (FLONASE) 50 MCG/ACT nasal spray Place 2 sprays into both nostrils daily.   Yes [provider]  memantine (NAMENDA) 10 MG tablet Take 10 mg by mouth 2 (two) times daily.   Yes [provider]  montelukast (SINGULAIR) 10 MG tablet Take 10 mg by mouth every morning.    Yes [provider]  nortriptyline (PAMELOR) 10 MG capsule Take 20 mg by mouth at bedtime.    Yes [provider]  omeprazole (PRILOSEC) 20 MG capsule Take 20 mg by mouth daily.  Yes [provider]  potassium chloride SA (K-DUR,KLOR-CON) 20 MEQ tablet Take 1 tablet (20 mEq total) by mouth daily. While on lasix 11/28/16  Yes Enid Baas, MD  predniSONE (DELTASONE) 5 MG tablet Take 1 tablet (5 mg total) by mouth daily with breakfast. 10/20/16  Yes Merwyn Katos, MD  risperiDONE (RISPERDAL) 0.5 MG tablet Take 0.5 mg by mouth 2 (two) times daily.   Yes [provider]  saccharomyces boulardii (FLORASTOR) 250 MG capsule Take 250 mg by mouth 2 (two) times daily.   Yes [provider]  sertraline (ZOLOFT) 100 MG tablet Take 100 mg by mouth at bedtime.    Yes [provider]  ipratropium-albuterol (DUONEB) 0.5-2.5 (3) MG/3ML SOLN Inhale 3 mLs into the lungs every 4 (four) hours as needed. 09/20/16 09/15/17  Merwyn Katos, MD  polyethylene glycol (MIRALAX / Ethelene Hal) packet Take 17 g by mouth daily as needed.     [provider]    Allergies Erythromycin; Sulfa antibiotics; and Vancomycin    Social History Social History  Substance Use Topics  . Smoking status: Former Games developer  . Smokeless tobacco: Former Neurosurgeon  . Alcohol use No    Review of Systems Patient denies headaches, rhinorrhea, blurry vision, numbness, shortness of breath, chest pain, edema, cough, abdominal pain, nausea, vomiting, diarrhea, dysuria, fevers, rashes or hallucinations unless otherwise stated above in HPI. ____________________________________________   PHYSICAL EXAM:  VITAL SIGNS: Vitals:   01/05/17 1430 01/05/17 1500  BP: 107/70 (!) 65/41  Pulse: 84 86  Resp: (!) 31 18  SpO2: 98% 98%    Constitutional: ill appearing and in moderate resp distress. Eyes: Conjunctivae are normal.  Head: Atraumatic. Nose: No congestion/rhinnorhea. Mouth/Throat: Mucous membranes are moist.   Neck: No stridor. Painless ROM.  Cardiovascular: tachycardic, regular rhythm. Grossly normal heart sounds.  Good peripheral circulation. Respiratory: tachypnea with audible wheezing and prolonged expiratory phase, speaking in short phrases Gastrointestinal: Soft and nontender. No distention. No abdominal bruits. No CVA tenderness. Genitourinary:  Musculoskeletal: No lower extremity tenderness nor edema.  No joint effusions. Neurologic:  Normal speech and language. No gross focal neurologic deficits are appreciated. No facial droop Skin:  Skin is warm, dry and intact. No rash noted. Psychiatric: unable to assess   ____________________________________________   LABS (all labs ordered are listed, but only abnormal results are displayed)  Results for orders placed or performed  during the hospital encounter of 01/05/17 (from the past 24 hour(s))  CBC with Differential/Platelet     Status: Abnormal   Collection Time: 01/05/17 10:31 AM  Result Value Ref Range   WBC 8.4 3.6 - 11.0 K/uL   RBC 5.30 (H) 3.80 - 5.20 MIL/uL   Hemoglobin 13.8 12.0 - 16.0 g/dL   HCT 78.2 95.6 - 21.3 %   MCV 79.5 (L) 80.0 - 100.0 fL   MCH 26.1 26.0 - 34.0 pg   MCHC 32.8 32.0 - 36.0 g/dL   RDW 08.6 (H) 57.8 - 46.9 %   Platelets 301 150 - 440 K/uL   Neutrophils Relative % 54 %   Neutro Abs 4.5 1.4 - 6.5 K/uL   Lymphocytes Relative 18 %   Lymphs Abs 1.5 1.0 - 3.6 K/uL   Monocytes Relative 11 %   Monocytes Absolute 0.9 0.2 - 0.9 K/uL   Eosinophils Relative 17 %   Eosinophils Absolute 1.4 (H) 0 - 0.7 K/uL   Basophils Relative 0 %   Basophils Absolute 0.0 0 - 0.1 K/uL  Lactic acid,  plasma     Status: None   Collection Time: 01/05/17 10:37 AM  Result Value Ref Range   Lactic Acid, Venous 1.9 0.5 - 1.9 mmol/L  Blood gas, arterial     Status: Abnormal   Collection Time: 01/05/17 10:51 AM  Result Value Ref Range   FIO2 0.25    Delivery systems BILEVEL POSITIVE AIRWAY PRESSURE    Inspiratory PAP 10    Expiratory PAP 5    pH, Arterial 7.43 7.350 - 7.450   pCO2 arterial 41 32.0 - 48.0 mmHg   pO2, Arterial 89 83.0 - 108.0 mmHg   Bicarbonate 27.2 20.0 - 28.0 mmol/L   Acid-Base Excess 2.6 (H) 0.0 - 2.0 mmol/L   O2 Saturation 97.1 %   Patient temperature 37.0    Collection site LEFT BRACHIAL    Sample type ARTERIAL DRAW    Allens test (pass/fail) PASS PASS  Comprehensive metabolic panel     Status: Abnormal   Collection Time: 01/05/17  1:41 PM  Result Value Ref Range   Sodium 140 135 - 145 mmol/L   Potassium 4.3 3.5 - 5.1 mmol/L   Chloride 104 101 - 111 mmol/L   CO2 26 22 - 32 mmol/L   Glucose, Bld 105 (H) 65 - 99 mg/dL   BUN 14 6 - 20 mg/dL   Creatinine, Ser 6.57 0.44 - 1.00 mg/dL   Calcium 8.6 (L) 8.9 - 10.3 mg/dL   Total Protein 6.9 6.5 - 8.1 g/dL   Albumin 3.5 3.5 - 5.0 g/dL    AST 27 15 - 41 U/L   ALT 20 14 - 54 U/L   Alkaline Phosphatase 76 38 - 126 U/L   Total Bilirubin 0.4 0.3 - 1.2 mg/dL   GFR calc non Af Amer >60 >60 mL/min   GFR calc Af Amer >60 >60 mL/min   Anion gap 10 5 - 15  Troponin I     Status: None   Collection Time: 01/05/17  1:41 PM  Result Value Ref Range   Troponin I <0.03 <0.03 ng/mL   ____________________________________________  EKG My review and personal interpretation at Time: 10:45   Indication: sob  Rate: 110  Rhythm: sinus Axis: normal Other: limited 2/2 baseline artifact and repiratory variation, no stemi ____________________________________________  RADIOLOGY  I personally reviewed all radiographic images ordered to evaluate for the above acute complaints and reviewed radiology reports and findings.  These findings were personally discussed with the patient.  Please see medical record for radiology report.  ____________________________________________   PROCEDURES  Procedure(s) performed:  Procedures    Critical Care performed: yes CRITICAL CARE Performed by: Willy Eddy   Total critical care time: 35 minutes  Critical care time was exclusive of separately billable procedures and treating other patients.  Critical care was necessary to treat or prevent imminent or life-threatening deterioration.  Critical care was time spent personally by me on the following activities: development of treatment plan with patient and/or surrogate as well as nursing, discussions with consultants, evaluation of patient's response to treatment, examination of patient, obtaining history from patient or surrogate, ordering and performing treatments and interventions, ordering and review of laboratory studies, ordering and review of radiographic studies, pulse oximetry and re-evaluation of patient's condition.  ____________________________________________   INITIAL IMPRESSION / ASSESSMENT AND PLAN / ED COURSE  Pertinent labs  & imaging results that were available during my care of the patient were reviewed by me and considered in my medical decision making (see chart for details).  DDX: Asthma, copd, CHF, pna, ptx, malignancy, Pe, anemia   Rock NephewLinda Swartzlander is a 71 y.o. who presents to the ED with Acute respiratory distress as described above. Patient with history of COPD and due to her wheezing seemed more consistent with this. No evidence of upper airway obstruction. Seems less consistent with congestive heart failure. Patient placed on BiPAP for respiratory support and started on nebulizer treatments with steroids and magnesium.  The patient will be placed on continuous pulse oximetry and telemetry for monitoring.  Laboratory evaluation will be sent to evaluate for the above complaints.     Clinical Course as of Jan 06 1532  Thu Jan 05, 2017  1104 patient reassessed and is much more comfortable on BiPAP. Does have extensive prolonged expiratory breathing basal diffuse wheezing.  [PR]  1142 patient reassessed again appears much more comfortable. Blood work thus far is reassuring. We'll continue to monitor.  [PR]  1416 patient reassessed. Does appear stable enough to remain off BiPAP at this point. We'll continue nebulizer treatments. Still has diffuse wheezing and that should benefit from observation in the hospital overnight.  [PR]    Clinical Course User Index [PR] Willy Eddyobinson, Ruhama Lehew, MD     ____________________________________________   FINAL CLINICAL IMPRESSION(S) / ED DIAGNOSES  Final diagnoses:  COPD exacerbation (HCC)      NEW MEDICATIONS STARTED DURING THIS VISIT:  New Prescriptions   No medications on file     Note:  This document was prepared using Dragon voice recognition software and may include unintentional dictation errors.    Willy Eddyobinson, Jadamarie Butson, MD 01/05/17 1534

## 2017-01-05 NOTE — H&P (Signed)
[facility] [place] at [facility]   PATIENT NAME: Jessica Webster    MR#:  [id number]  DATE OF BIRTH:  12-Oct-1945  DATE OF ADMISSION:  01/05/2017  PRIMARY CARE PHYSICIAN: [facility]   REQUESTING/REFERRING PHYSICIAN: Willy Eddy, MD  CHIEF COMPLAINT:  sob  HISTORY OF PRESENT ILLNESS:  Jessica Webster  is a 71 y.o. female with a known history of COPD, all time as dementia, hypertension, hyperlipidemia and other medical problems is brought into the ED from [facility] memory care unit For shortness of breath. By the time she arrived to the ED she had audible wheezing and extremely short of breath. Patient has baseline dementia and was unable to provide any history. Patient was initially placed on BiPAP, eventually bipap weaned off plan patient is placed on 2 L of oxygen via nasal cannula. Hospitalist team is called to admit the patient. During my examination patient was lethargic as she has received Ativan In the ED but arousable. Husband at bedside.  PAST MEDICAL HISTORY:   Past Medical History:  Diagnosis Date  . Alzheimer disease   . Anxiety   . Asthma   . Hypertension     PAST SURGICAL HISTOIRY:   Past Surgical History:  Procedure Laterality Date  . APPENDECTOMY    . NASAL SINUS SURGERY      SOCIAL HISTORY:   Social History  Substance Use Topics  . Smoking status: Former Games developer  . Smokeless tobacco: Former Neurosurgeon  . Alcohol use No    FAMILY HISTORY:   Family History  Problem Relation Age of Onset  . Asthma Father   . Brain cancer Brother     DRUG ALLERGIES:   Allergies  Allergen Reactions  . Erythromycin Hives  . Sulfa Antibiotics Hives  . Vancomycin Rash    Noted redness at IV site during infusion.     REVIEW OF SYSTEMS:  Review of systems obtainable as the patient is chronically demented and lethargic  MEDICATIONS AT HOME:   Prior to Admission medications   Medication Sig Start Date End Date Taking?  Authorizing Provider  ALPRAZolam (XANAX) 0.25 MG tablet Take 1 tablet (0.25 mg total) by mouth 2 (two) times daily. Patient taking differently: Take 0.25 mg by mouth 3 (three) times daily.  11/04/16  Yes Wieting, Richard, MD  arformoterol (BROVANA) 15 MCG/2ML NEBU Take 2 mLs (15 mcg total) by nebulization 2 (two) times daily. 08/02/16  Yes Merwyn Katos, MD  aspirin EC 81 MG tablet Take 81 mg by mouth daily.   Yes [provider]  atorvastatin (LIPITOR) 20 MG tablet Take 20 mg by mouth at bedtime.   Yes [provider]  budesonide (PULMICORT) 0.5 MG/2ML nebulizer solution Take 2 mLs (0.5 mg total) by nebulization 2 (two) times daily. 08/02/16 08/02/17 Yes Merwyn Katos, MD  buPROPion (WELLBUTRIN XL) 150 MG 24 hr tablet Take 150 mg by mouth daily. 11/09/16  Yes [provider]  divalproex (DEPAKOTE) 250 MG DR tablet Take 1 tablet (250 mg total) by mouth every 12 (twelve) hours. 11/04/16  Yes Wieting, Richard, MD  donepezil (ARICEPT) 10 MG tablet Take 10 mg by mouth every morning.    Yes [provider]  ferrous sulfate 325 (65 FE) MG tablet Take 325 mg by mouth daily with breakfast.   Yes [provider]  fluticasone (FLONASE) 50 MCG/ACT nasal spray Place 2 sprays into both nostrils daily.   Yes [provider]  memantine (NAMENDA) 10 MG  tablet Take 10 mg by mouth 2 (two) times daily.   Yes [provider]  montelukast (SINGULAIR) 10 MG tablet Take 10 mg by mouth every morning.    Yes [provider]  nortriptyline (PAMELOR) 10 MG capsule Take 20 mg by mouth at bedtime.    Yes [provider]  omeprazole (PRILOSEC) 20 MG capsule Take 20 mg by mouth daily.   Yes [provider]  potassium chloride SA (K-DUR,KLOR-CON) 20 MEQ tablet Take 1 tablet (20 mEq total) by mouth daily. While on lasix 11/28/16  Yes Enid BaasKalisetti, Radhika, MD  predniSONE (DELTASONE) 5 MG tablet Take 1 tablet (5 mg total) by mouth daily with  breakfast. 10/20/16  Yes Merwyn KatosSimonds, David B, MD  risperiDONE (RISPERDAL) 0.5 MG tablet Take 0.5 mg by mouth 2 (two) times daily.   Yes [provider]  saccharomyces boulardii (FLORASTOR) 250 MG capsule Take 250 mg by mouth 2 (two) times daily.   Yes [provider]  sertraline (ZOLOFT) 100 MG tablet Take 100 mg by mouth at bedtime.    Yes [provider]  ipratropium-albuterol (DUONEB) 0.5-2.5 (3) MG/3ML SOLN Inhale 3 mLs into the lungs every 4 (four) hours as needed. 09/20/16 09/15/17  Merwyn KatosSimonds, David B, MD  polyethylene glycol (MIRALAX / Ethelene HalGLYCOLAX) packet Take 17 g by mouth daily as needed.     [provider]      VITAL SIGNS:  Blood pressure 136/80, pulse 85, resp. rate (!) 33, weight 68 kg (150 lb), SpO2 100 %.  PHYSICAL EXAMINATION:  GENERAL:  71 y.o.-year-old patient lying in the bed with no acute distress.  EYES: Pupils equal, round, reactive to light and accommodation. No scleral icterus.HEENT: Head atraumatic, normocephalic. Oropharynx and nasopharynx clear.  NECK:  Supple, no jugular venous distention. No thyroid enlargement, no tenderness.  LUNGS: diminished breath sounds bilaterally, minimal diffuse wheezing, rales,rhonchi or crepitation. No use of accessory muscles of respiration.  CARDIOVASCULAR: S1, S2 normal. No murmurs, rubs, or gallops.  ABDOMEN: Soft, nontender, nondistended. Bowel sounds present. No organomegaly or mass.  EXTREMITIES: No pedal edema, cyanosis, or clubbing.  NEUROLOGIC: lethargic and chronically demented PSYCHIATRIC: The patient is Disoriented and chronically demented SKIN: No obvious rash, lesion, or ulcer.   LABORATORY PANEL:   CBC  Recent Labs Lab 01/05/17 1031  WBC 8.4  HGB 13.8  HCT 42.2  PLT 301   ------------------------------------------------------------------------------------------------------------------  Chemistries   Recent Labs Lab 01/05/17 1341  NA 140  K 4.3  CL 104  CO2 26  GLUCOSE 105*   BUN 14  CREATININE 0.75  CALCIUM 8.6*  AST 27  ALT 20  ALKPHOS 76  BILITOT 0.4   ------------------------------------------------------------------------------------------------------------------  Cardiac Enzymes  Recent Labs Lab 01/05/17 1341  TROPONINI <0.03   ------------------------------------------------------------------------------------------------------------------  RADIOLOGY:  Dg Chest Portable 1 View  Result Date: 01/05/2017 CLINICAL DATA:  Shortness of Breath EXAM: PORTABLE CHEST 1 VIEW COMPARISON:  11/23/2016 FINDINGS: Heart and mediastinal contours are within normal limits. No focal opacities or effusions. No acute bony abnormality. IMPRESSION: No active disease. Electronically Signed   By: Charlett NoseKevin  Dover M.D.   On: 01/05/2017 10:48    EKG:   Orders placed or performed during the hospital encounter of 01/05/17  . ED EKG  . ED EKG  . EKG 12-Lead  . EKG 12-Lead    IMPRESSION AND PLAN:   Jessica NephewLinda Spoto  is a 71 y.o. female with a known history of COPD, all time as dementia, hypertension, hyperlipidemia and other medical problems is  brought into the ED from [facility] memory care unit For shortness of breath. By the time she arrived to the ED she had audible wheezing and extremely short of breath. Patient has baseline dementia and was unable to provide any history. Patient was initially placed on BiPAP, eventually bipap weaned off plan patient is placed on 2   #acute hypoxic respiratory failure secondary to acute COPD exacerbation Admit to MedSurg unit Initially patient was on BiPAP currently off and on 2 L of oxygen via nasal cannula, will continue and wean off as tolerated Solum Medrol 125 milli grams IV was given in the ED. Will continue Solu-Medrol 60 IV every 8 hours DU neb nebulizer treatments every 6 hours Albuterol as needed Chest x-ray negative  #ssential hypertension Not on any home medications will monitor closely,Currently blood pressure is  soft  #Hyperlipidemia continue Lipitor  #Alzheimer's dementia Continue home medications Namenda, Zoloft, Risperdal   DVT prophylaxis with Lovenox subcutaneous     All the records are reviewed and case discussed with ED provider. Management plans discussed with the patient, family and they are in agreement.  CODE STATUS: DNR/ HUSBAND IS THE HEALTHCARE POWER OF ATTORNEY  TOTAL TIME TAKING CARE OF THIS PATIENT: 45 minutes.   Note: This dictation was prepared with Dragon dictation along with smaller phrase technology. Any transcriptional errors that result from this process are unintentional.  Ramonita Lab M.D on 01/05/2017 at 4:57 PM  Between 7am to 6pm - Pager - [phone]  After 6pm go to [email or web address] - password [email or web address] [facility]  [facility]  Office  [phone]  CC: Primary care physician; [facility]

## 2017-01-05 NOTE — ED Notes (Signed)
Pt taken off bipap for trial. RT notified. Family at bedside.

## 2017-01-05 NOTE — ED Notes (Signed)
RT at bedside for bipap placement, pt tolerating well, family at bedside

## 2017-01-05 NOTE — ED Notes (Signed)
EDP at bedside, pt brought in by family, states hx of asthma, SOB that began this AM, hx of dementia, pt making wheezing noise, family states hx of dementia and anxiety

## 2017-01-05 NOTE — ED Notes (Signed)
Pt cleaned and new diaper placed on pt.

## 2017-01-05 NOTE — Progress Notes (Signed)
Pts family called nurse to the room stating "she's choking." Nurse responded to room and pt was coughing and very red in the face. Nurse and nurse tech sat pt to the side of the bed. She continued to cough but began speaking. O2 sat was taking and pt was at 98%. Pt was then administered a breathing tx. Pt resumed normal color. Family reported that pt began coughing after she took a sip of tea from her dinner tray. Dr. Juliene PinaMody notified and pt ordered to have a swallow study and a chest x-ray.

## 2017-01-06 LAB — CBC
HEMATOCRIT: 39.6 % (ref 35.0–47.0)
Hemoglobin: 13.1 g/dL (ref 12.0–16.0)
MCH: 26.4 pg (ref 26.0–34.0)
MCHC: 33.1 g/dL (ref 32.0–36.0)
MCV: 79.7 fL — AB (ref 80.0–100.0)
PLATELETS: 215 10*3/uL (ref 150–440)
RBC: 4.97 MIL/uL (ref 3.80–5.20)
RDW: 23.3 % — ABNORMAL HIGH (ref 11.5–14.5)
WBC: 6.5 10*3/uL (ref 3.6–11.0)

## 2017-01-06 LAB — COMPREHENSIVE METABOLIC PANEL
ALT: 20 U/L (ref 14–54)
ANION GAP: 11 (ref 5–15)
AST: 35 U/L (ref 15–41)
Albumin: 3.5 g/dL (ref 3.5–5.0)
Alkaline Phosphatase: 70 U/L (ref 38–126)
BUN: 17 mg/dL (ref 6–20)
CALCIUM: 8.8 mg/dL — AB (ref 8.9–10.3)
CO2: 25 mmol/L (ref 22–32)
Chloride: 106 mmol/L (ref 101–111)
Creatinine, Ser: 0.87 mg/dL (ref 0.44–1.00)
GFR calc non Af Amer: >60 mL/min (ref >60)
GLUCOSE: 159 mg/dL — AB (ref 65–99)
POTASSIUM: 4.7 mmol/L (ref 3.5–5.1)
SODIUM: 142 mmol/L (ref 135–145)
Total Bilirubin: 0.5 mg/dL (ref 0.3–1.2)
Total Protein: 7.1 g/dL (ref 6.5–8.1)

## 2017-01-06 LAB — TSH: TSH: 0.377 u[IU]/mL (ref 0.350–4.500)

## 2017-01-06 LAB — MRSA PCR SCREENING: MRSA by PCR: POSITIVE — AB

## 2017-01-06 MED ORDER — ASPIRIN 81 MG PO CHEW
81.0000 mg | CHEWABLE_TABLET | Freq: Every day | ORAL | Status: DC
  Administered 2017-01-07 – 2017-01-08 (×2): 81 mg via ORAL
  Filled 2017-01-06 (×2): qty 1

## 2017-01-06 MED ORDER — DIVALPROEX SODIUM 125 MG PO CSDR
250.0000 mg | DELAYED_RELEASE_CAPSULE | Freq: Two times a day (BID) | ORAL | Status: DC
  Administered 2017-01-06 – 2017-01-08 (×4): 250 mg via ORAL
  Filled 2017-01-06 (×5): qty 2

## 2017-01-06 MED ORDER — METHYLPREDNISOLONE SODIUM SUCC 125 MG IJ SOLR
60.0000 mg | Freq: Every day | INTRAMUSCULAR | Status: DC
  Administered 2017-01-07: 60 mg via INTRAVENOUS
  Filled 2017-01-06: qty 2

## 2017-01-06 NOTE — Progress Notes (Signed)
Please note patient has been followed by Palliative NP at [facility]. Thank you. Dayna Barker RN, BSN, [facility] and [facility], hospital Liaison [phone] c

## 2017-01-06 NOTE — Progress Notes (Signed)
Initial Nutrition Assessment  DOCUMENTATION CODES:   Not applicable  INTERVENTION:   Magic cup TID with meals, each supplement provides 290 kcal and 9 grams of protein  Mighty Shake II TID with meals, each supplement provides 480-500 kcals and 20-23 grams of protein  Assist with meals  NUTRITION DIAGNOSIS:   Inadequate oral intake related to dysphagia (dementia ) as evidenced by other (see comment), per patient/family report (pt eating 50% of DYS 1 diet).  GOAL:   Patient will meet greater than or equal to 90% of their needs  MONITOR:   PO intake, Supplement acceptance, Labs, Weight trends  REASON FOR ASSESSMENT:   Low Braden    ASSESSMENT:   71 y.o. female with a known history of COPD, all time as dementia, hypertension, hyperlipidemia and other medical problems is brought into the ED from [facility] memory care unit admitted for COPD exacerbation    Visited pt's room today; unable to talk with pt r/t dementia so history obtained from family member at bedside. Per family member at bedside, pt resides in [facility] memory care unit. Pt is able to feed herself sometimes but mainly she needs assistance when eating. Pt is normally a good eater. Per chart, her weight is stable. Pt had an episode of possible aspiration yesterday where she started coughing after her meal. Pt s/p SLP evaluation today and approved for a DYS 1/nectar thick diet. RD will order nectar thick supplements. RD provided family member with DYS 1 menu options and information on how to order Magic Cups through [facility]. Per MD, no signs of PNA today; pt may possibly discharge in 1-2 days.    Medications reviewed and include: aspirin, lovenox, ferrous sulfate, solu- medrol, protonix, KCl, florastor, NaCl /hr   Labs reviewed: Ca 8.8(L)  Nutrition-Focused physical exam completed. Findings are no fat depletion, no muscle depletion, and no edema.   Diet Order:  DIET - DYS 1 Room service appropriate?  Yes with Assist; Fluid consistency: Nectar Thick  Skin:  Reviewed, no issues  Last BM:  PTA  Height:   Ht Readings from Last 1 Encounters:  01/06/17  (1.702 m)    Weight:   Wt Readings from Last 1 Encounters:  01/06/17 153 lb 3.2 oz (69.5 kg)    Ideal Body Weight:  61.4 kg  BMI:  Body mass index is 23.99 kg/m.  Estimated Nutritional Needs:   Kcal:  1600-1900kcal/day   Protein:  68-82g/day   Fluid:  >1.6L/day   EDUCATION NEEDS:   Education needs addressed  Betsey Holiday MS, RD, LDN Pager #[phone] After Hours Pager: [phone]

## 2017-01-06 NOTE — Evaluation (Signed)
Clinical/Bedside Swallow Evaluation Patient Details  Name: Jessica Webster MRN: [id number] Date of Birth: June 30, 1945  Today's Date: 01/06/2017 Time: SLP Start Time (ACUTE ONLY): 0955 SLP Stop Time (ACUTE ONLY): 1055 SLP Time Calculation (min) (ACUTE ONLY): 60 min  Past Medical History:  Past Medical History:  Diagnosis Date  . Alzheimer disease   . Anxiety   . Asthma   . Hypertension    Past Surgical History:  Past Surgical History:  Procedure Laterality Date  . APPENDECTOMY    . NASAL SINUS SURGERY     HPI:  Pt is a 71 y.o. female with a known history of Hiatal Hernia, anxiety, COPD, all time as dementia, hypertension, hyperlipidemia and other medical problems is brought into the ED from [facility] unit. By the time she arrived to the ED she had audible wheezing and extremely short of breath. Patient has baseline dementia and was unable to provide any history. Patient was initially placed on BiPAP, eventually bipap weaned off plan patient is placed on 2 L of oxygen via nasal cannula. Hospitalist team is called to admit the patient. During the ED admission, patient was lethargic as she has received Ativan. Pt currently more alert, awake; confusion noted in her interactions w/ SLP and husband c/b muttered speech and statements.    Assessment / Plan / Recommendation Clinical Impression  Pt appears to present w/ oropharyngeal phase dysphagia impacted by her declined Cognitive status(Dementia at baseline); this can increase risk for aspiration to occur. Pt's NH has already downgraded diet consistency to puree but family and NSG staff this admission have noted overt coughing w/ thin liquids recently. Pt consumed po trials of Nectar liquids via cup/spoon w/ no overt s/s of aspiration and no noted oral phase deficits noted as well. Oral phase was wfl for bolus management and and oral clearing w/ all consistency trials. Pt exhibited no overt s/s of aspiration w/ the nectar liquids  consumed. Due to concern for aspiration and pt's noted decline in cognitive status, recommend a Dysphagia level 1(puree) w/ Nectar liquids diet; aspiration precautions; Pills in puree - Crushed as able. Husband would like to monitor pt's toleration of this consistency b/f attempting objective swallow studies d/t her level of Dementia. Time spent giving education to husband on diet consistency and food/drink options/ordering of the thickened liquids; food preparation. MD consulted and agreed.  SLP Visit Diagnosis: Dysphagia, oropharyngeal phase (R13.12)    Aspiration Risk  Mild aspiration risk (impacted by Cognitive status)    Diet Recommendation  Dysphagia level 1(puree) w/ Nectar consistency liquids; aspiration precautions; feeding support  Medication Administration: Crushed with puree    Other  Recommendations Recommended Consults:  (Dietician f/u) Oral Care Recommendations: Oral care BID;Staff/trained caregiver to provide oral care Other Recommendations: Order thickener from pharmacy;Prohibited food (jello, ice cream, thin soups);Remove water pitcher;Have oral suction available   Follow up Recommendations Skilled Nursing facility (for education w/ staff)      Frequency and Duration min 2x/week  1 week       Prognosis Prognosis for Safe Diet Advancement: Fair Barriers to Reach Goals: Cognitive deficits;Severity of deficits      Swallow Study   General Date of Onset: 01/05/17 HPI: Pt is a 71 y.o. female with a known history of Hiatal Hernia, anxiety, COPD, all time as dementia, hypertension, hyperlipidemia and other medical problems is brought into the ED from [facility] [place] [facility] unit. By the time she arrived to the ED she had audible wheezing and extremely short  of breath. Patient has baseline dementia and was unable to provide any history. Patient was initially placed on BiPAP, eventually bipap weaned off plan patient is placed on 2 L of oxygen via nasal cannula. Hospitalist  team is called to admit the patient. During the ED admission, patient was lethargic as she has received Ativan. Pt currently more alert, awake; confusion noted in her interactions w/ SLP and husband c/b muttered speech and statements.  Type of Study: Bedside Swallow Evaluation Previous Swallow Assessment: none indicated but pt's diet had recently been changed to Puree by staff at [facility]. Diet Prior to this Study: Dysphagia 1 (puree);Thin liquids Temperature Spikes Noted: No (wbc 6.5) Respiratory Status: Nasal cannula (2 liters) History of Recent Intubation: No (BIPAP`) Behavior/Cognition: Alert;Cooperative;Confused;Distractible;Requires cueing;Doesn't follow directions Oral Cavity Assessment: Dry (limited) Oral Care Completed by SLP: Recent completion by staff Oral Cavity - Dentition: Adequate natural dentition (noted) Vision:  (n/a) Self-Feeding Abilities: Total assist Patient Positioning: Upright in bed Baseline Vocal Quality: Low vocal intensity (muttered speech) Volitional Cough: Cognitively unable to elicit Volitional Swallow: Unable to elicit    Oral/Motor/Sensory Function Overall Oral Motor/Sensory Function: Within functional limits (w/ bolus management)   Ice Chips Ice chips: Not tested   Thin Liquid Thin Liquid: Not tested    Nectar Thick Nectar Thick Liquid: Within functional limits Presentation: Cup;Self Fed;Spoon (assisted; ~3 ozs) Other Comments: needed support   Honey Thick Honey Thick Liquid: Not tested   Puree Puree: Within functional limits Presentation: Spoon (fed; 3-4 ozs)   Solid   GO   Solid: Not tested    Functional Assessment Tool Used: clinical judgement Functional Limitations: Swallowing Swallow Current Status (Z6109): At least 20 percent but less than 40 percent impaired, limited or restricted Swallow Goal Status [phone]): At least 20 percent but less than 40 percent impaired, limited or restricted Swallow Discharge Status [phone]): At least 20 percent but  less than 40 percent impaired, limited or restricted    Jerilynn Som, MS, CCC-SLP Watson,Katherine 01/06/2017,12:57 PM

## 2017-01-06 NOTE — Progress Notes (Signed)
[employer] - [place] at [facility]   PATIENT NAME: Jessica Webster    MR#:  [id number]  DATE OF BIRTH:  1945/07/15  SUBJECTIVE:   Pt. Here due to shortness of breath due to suspected aspiration pneumonia. Non-verbal due to advanced dementia.  Husband at bedside. Pt. Is presently not in resp. Distress. Wheezing/shortness of breath improved.   REVIEW OF SYSTEMS:    Review of Systems  Unable to perform ROS: Dementia    Nutrition: Dysphagia 1 with nectar thick liquids Tolerating Diet: Some  Tolerating PT: Await Eval.    DRUG ALLERGIES:   Allergies  Allergen Reactions  . Erythromycin Hives  . Sulfa Antibiotics Hives  . Vancomycin Rash    Noted redness at IV site during infusion.     VITALS:  Blood pressure (!) 147/67, pulse 82, temperature 97.7 F (36.5 C), temperature source Oral, resp. rate 18, height  (1.702 m), weight 69.5 kg (153 lb 3.2 oz), SpO2 98 %.  PHYSICAL EXAMINATION:   Physical Exam  GENERAL:  71 y.o.-year-old patient lying in bed in no acute distress.  EYES: Pupils equal, round, reactive to light and accommodation. No scleral icterus. Extraocular muscles intact.  HEENT: Head atraumatic, normocephalic. Oropharynx and nasopharynx clear.  NECK:  Supple, no jugular venous distention. No thyroid enlargement, no tenderness.  LUNGS: Poor Resp. Effort, minimal end-exp. wheezing, rales, rhonchi. No use of accessory muscles of respiration.  CARDIOVASCULAR: S1, S2 normal. No murmurs, rubs, or gallops.  ABDOMEN: Soft, nontender, nondistended. Bowel sounds present. No organomegaly or mass.  EXTREMITIES: No cyanosis, clubbing or edema b/l.    NEUROLOGIC: Cranial nerves II through XII are intact. No focal Motor or sensory deficits b/l.   PSYCHIATRIC: The patient is alert and oriented x 3.  SKIN: No obvious rash, lesion, or ulcer.    LABORATORY PANEL:   CBC  Recent Labs Lab 01/06/17 0414  WBC 6.5  HGB 13.1  HCT 39.6  PLT 215    ------------------------------------------------------------------------------------------------------------------  Chemistries   Recent Labs Lab 01/06/17 0414  NA 142  K 4.7  CL 106  CO2 25  GLUCOSE 159*  BUN 17  CREATININE 0.87  CALCIUM 8.8*  AST 35  ALT 20  ALKPHOS 70  BILITOT 0.5   ------------------------------------------------------------------------------------------------------------------  Cardiac Enzymes  Recent Labs Lab 01/05/17 1341  TROPONINI <0.03   ------------------------------------------------------------------------------------------------------------------  RADIOLOGY:  Dg Chest 1 View  Result Date: 01/05/2017 CLINICAL DATA:  71 y.o. female with a known history of COPD, alzheimers, dementia, hypertension, hyperlipidemia admitted today for SOB. Former smoker EXAM: CHEST 1 VIEW COMPARISON:  01/05/2017 at 10:32 a.m. FINDINGS: Cardiac silhouette is normal in size and configuration. There is a small hiatal hernia. No mediastinal or hilar masses. No convincing adenopathy. Lungs are mildly hyperexpanded but clear. No convincing pleural effusion. No pneumothorax. Skeletal structures are grossly intact. IMPRESSION: No acute cardiopulmonary disease. Electronically Signed   By: Amie Portland M.D.   On: 01/05/2017 19:04   Dg Chest Portable 1 View  Result Date: 01/05/2017 CLINICAL DATA:  Shortness of Breath EXAM: PORTABLE CHEST 1 VIEW COMPARISON:  11/23/2016 FINDINGS: Heart and mediastinal contours are within normal limits. No focal opacities or effusions. No acute bony abnormality. IMPRESSION: No active disease. Electronically Signed   By: Charlett Nose M.D.   On: 01/05/2017 10:48     ASSESSMENT AND PLAN:   71 yo female w/ hx of HTN, Anxiety, Asthma, Alzheimer's dementia who presented to the hospital from memory care due to shortness of breath  and wheezing.  1. Acute respiratory failure with hypoxia-secondary to aspiration pneumonitis. -Much improved since  yesterday and will wean IV steroids, continue aggressive pulmonary toileting, continue duo nebs scheduled - improving. Cont. O2 supplementation and will wean as tolerated.   2. Aspiration Pneumonitis - improving. Cont. IV steroids and will taper  - cont. Duonebs, Brovana - improving. Appreciate Speech eval and cont. Dysphagia 1 with nectar thick liquids  3. Anxiety/depression - cont. Xanax, Wellbutrin.   4. Dementia w/ behavioral disturbanc - cont. Aricept.  - cont. Depakote, Risperdal.   5. GERD - cont. Protonix.   6. Hyperlipidemia - cont. Atorvastatin.   Will get Palliative Care to discuss goals of care  All the records are reviewed and case discussed with Care Management/Social Worker. Management plans discussed with the patient, family and they are in agreement.  CODE STATUS: DNR  DVT Prophylaxis: Lovenox  TOTAL TIME TAKING CARE OF THIS PATIENT: 30 minutes.   POSSIBLE D/C IN 1-2 DAYS, DEPENDING ON CLINICAL CONDITION.   Houston Siren M.D on 01/06/2017 at 2:22 PM  Between 7am to 6pm - Pager - [phone]  After 6pm go to [email or web address] - Therapist, nutritional Hospitalists  Office  [phone]  CC: Primary care physician; [facility]

## 2017-01-06 NOTE — Plan of Care (Signed)
Problem: SLP Dysphagia Goals Goal: Misc Dysphagia Goal Pt will safely tolerate po diet of least restrictive consistency w/ no overt s/s of aspiration noted by Staff/pt/family x3 sessions.    

## 2017-01-06 NOTE — Care Management Important Message (Signed)
Important Message  Patient Details  Name: Deosha Werden MRN: [id number] Date of Birth: 03-19-1946   Medicare Important Message Given:  Yes    Chapman Fitch, RN 01/06/2017, 10:29 AM

## 2017-01-07 MED ORDER — PREDNISONE 20 MG PO TABS
40.0000 mg | ORAL_TABLET | Freq: Every day | ORAL | Status: DC
  Administered 2017-01-08: 40 mg via ORAL
  Filled 2017-01-07: qty 2

## 2017-01-07 MED ORDER — PREDNISONE 20 MG PO TABS: 30.0000 mg | ORAL_TABLET | Freq: Every day | ORAL | Status: DC

## 2017-01-07 MED ORDER — PREDNISONE 20 MG PO TABS: 20.0000 mg | ORAL_TABLET | Freq: Every day | ORAL | Status: DC

## 2017-01-07 MED ORDER — PREDNISONE 20 MG PO TABS: 10.0000 mg | ORAL_TABLET | Freq: Every day | ORAL | Status: DC

## 2017-01-07 NOTE — Clinical Social Work Note (Signed)
Clinical Social Work Assessment  Patient Details  Name: Jessica Webster MRN: [id number] Date of Birth: 08/28/1945  Date of referral:  01/06/17               Reason for consult:  Facility Placement                Permission sought to share information with:  Oceanographer granted to share information::  Yes, Verbal Permission Granted  Name::        Agency::  [facility]  Relationship::     Contact Information:     Housing/Transportation Living arrangements for the past 2 months:  Assisted Dealer of Information:  Medical Team, Facility, Spouse Patient Interpreter Needed:  None Criminal Activity/Legal Involvement Pertinent to Current Situation/Hospitalization:  No - Comment as needed Significant Relationships:  Merchandiser, retail, Adult Children, Spouse Lives with:  Facility Resident Do you feel safe going back to the place where you live?  Yes Need for family participation in patient care:  Yes (Comment) (Patient has dementia)  Care giving concerns:  Patient admitted from [facility]   Social Worker assessment / plan:  The CSW confirmed with the patient's husband that the patient admitted from [facility]. The family would like her to return when stable, and the facility has confirmed that she can return. The discharge is set for 01/08/17 via non-emergent EMS. CSW will continue to follow for discharge facilitation.  Employment status:  Retired Health and safety inspector:  Medicare PT Recommendations:  Not assessed at this time Information / Referral to community resources:     Patient/Family's Response to care: The patient's husband thanked the CSW.  Patient/Family's Understanding of and Emotional Response to Diagnosis, Current Treatment, and Prognosis:  The family understands the discharge plan and are in agreement.  Emotional Assessment Appearance:  Appears stated age Attitude/Demeanor/Rapport:  Lethargic Affect (typically observed):   Stable Orientation:  Oriented to Self Alcohol / Substance use:  Never Used Psych involvement (Current and /or in the community):  No (Comment)  Discharge Needs  Concerns to be addressed:  Care Coordination, Discharge Planning Concerns Readmission within the last 30 days:  Yes Current discharge risk:  Chronically ill, Cognitively Impaired Barriers to Discharge:  Continued Medical Work up   [employer], LCSW 01/07/2017, 2:11 PM

## 2017-01-07 NOTE — NC FL2 (Signed)
[place] MEDICAID FL2 LEVEL OF CARE SCREENING TOOL     IDENTIFICATION  Patient Name: Jessica Webster Birthdate: 1945-11-09 Sex: female Admission Date (Current Location): 01/05/2017  [place] and [place] Number:  Chiropodist and Address:  [facility], [address], [place], [place] [address]      Provider Number: [id number]  Attending Physician Name and Address:  Houston Siren, MD  Relative Name and Phone Number:       Current Level of Care: Hospital Recommended Level of Care: Memory Care Prior Approval Number:    Date Approved/Denied:   PASRR Number:  [id number]  Discharge Plan:  (ALF Memory Care)    Current Diagnoses: Patient Active Problem List   Diagnosis Date Noted  . COPD with acute exacerbation (HCC) 01/05/2017  . Community acquired pneumonia of right lower lobe of lung (HCC)   . Goals of care, counseling/discussion   . Palliative care encounter   . Altered mental status 11/23/2016  . Seizure (HCC) 11/01/2016    Orientation RESPIRATION BLADDER Height & Weight     Self  Normal Incontinent Weight: 153 lb 3.2 oz (69.5 kg) (bed weight ) Height:   (170.2 cm)  BEHAVIORAL SYMPTOMS/MOOD NEUROLOGICAL BOWEL NUTRITION STATUS   (none)  (none) Continent Diet (dysphagia 1, nectar thick)  AMBULATORY STATUS COMMUNICATION OF NEEDS Skin   Total Care Verbally Normal                       Personal Care Assistance Level of Assistance  Total care       Total Care Assistance: Maximum assistance   Functional Limitations Info   (no reported issues)          SPECIAL CARE FACTORS FREQUENCY                       Contractures Contractures Info: Not present    Additional Factors Info  Code Status, Allergies Code Status Info: dnr Allergies Info: erythromycin, sulfa abx; vanc           Current Medications (01/07/2017):  This is the current hospital active medication list Current Facility-Administered  Medications  Medication Dose Route Frequency Provider Last Rate Last Dose  . albuterol (PROVENTIL) (2.5 MG/3ML) 0.083% nebulizer solution 2.5 mg  2.5 mg Nebulization Q4H PRN Gouru, Aruna, MD   2.5 mg at 01/05/17 1818  . ALPRAZolam Prudy Feeler) tablet 0.25 mg  0.25 mg Oral BID Gouru, Aruna, MD   0.25 mg at 01/07/17 0921  . arformoterol (BROVANA) nebulizer solution 15 mcg  15 mcg Nebulization BID Ramonita Lab, MD   15 mcg at 01/06/17 2021  . aspirin chewable tablet 81 mg  81 mg Oral Daily Houston Siren, MD   81 mg at 01/07/17 7846  . atorvastatin (LIPITOR) tablet 20 mg  20 mg Oral QHS Gouru, Aruna, MD   20 mg at 01/06/17 2054  . budesonide (PULMICORT) nebulizer solution 0.5 mg  0.5 mg Nebulization BID Gouru, Aruna, MD   0.5 mg at 01/07/17 0724  . buPROPion (WELLBUTRIN XL) 24 hr tablet 150 mg  150 mg Oral Daily Gouru, Deanna Artis, MD   Stopped at 01/06/17 1000  . divalproex (DEPAKOTE SPRINKLE) capsule 250 mg  250 mg Oral Q12H Houston Siren, MD   250 mg at 01/07/17 0921  . donepezil (ARICEPT) tablet 10 mg  10 mg Oral q morning - 10a Gouru, Aruna, MD   10 mg at 01/07/17 0920  .  enoxaparin (LOVENOX) injection 40 mg  40 mg Subcutaneous Q24H Gouru, Aruna, MD   40 mg at 01/06/17 2048  . ferrous sulfate tablet 325 mg  325 mg Oral Q breakfast Ramonita Lab, MD   Stopped at 01/06/17 0800  . fluticasone (FLONASE) 50 MCG/ACT nasal spray 2 spray  2 spray Each Nare Daily Ramonita Lab, MD   Stopped at 01/06/17 1000  . ipratropium-albuterol (DUONEB) 0.5-2.5 (3) MG/3ML nebulizer solution 3 mL  3 mL Inhalation Q6H Gouru, Aruna, MD   3 mL at 01/07/17 1334  . memantine (NAMENDA) tablet 10 mg  10 mg Oral BID Ramonita Lab, MD   10 mg at 01/07/17 0921  . montelukast (SINGULAIR) tablet 10 mg  10 mg Oral q morning - 10a Gouru, Aruna, MD   10 mg at 01/07/17 0921  . nortriptyline (PAMELOR) capsule 20 mg  20 mg Oral QHS Gouru, Deanna Artis, MD   20 mg at 01/06/17 2056  . ondansetron (ZOFRAN) tablet 4 mg  4 mg Oral Q6H PRN Gouru, Aruna, MD        Or  . ondansetron (ZOFRAN) injection 4 mg  4 mg Intravenous Q6H PRN Gouru, Aruna, MD      . pantoprazole (PROTONIX) EC tablet 40 mg  40 mg Oral Daily Gouru, Deanna Artis, MD   Stopped at 01/06/17 1000  . polyethylene glycol (MIRALAX / GLYCOLAX) packet 17 g  17 g Oral Daily PRN Gouru, Aruna, MD      . potassium chloride SA (K-DUR,KLOR-CON) CR tablet 20 mEq  20 mEq Oral Daily Gouru, Aruna, MD   20 mEq at 01/07/17 0921  . [START ON 01/08/2017] predniSONE (DELTASONE) tablet 40 mg  40 mg Oral Q breakfast Houston Siren, MD       And  . Melene Muller ON 01/09/2017] predniSONE (DELTASONE) tablet 30 mg  30 mg Oral Q breakfast Houston Siren, MD       And  . Melene Muller ON 01/10/2017] predniSONE (DELTASONE) tablet 20 mg  20 mg Oral Q breakfast Houston Siren, MD       And  . Melene Muller ON 01/11/2017] predniSONE (DELTASONE) tablet 10 mg  10 mg Oral Q breakfast Houston Siren, MD      . risperiDONE (RISPERDAL) tablet 0.5 mg  0.5 mg Oral BID Gouru, Aruna, MD   0.5 mg at 01/07/17 0920  . saccharomyces boulardii (FLORASTOR) capsule 250 mg  250 mg Oral BID Gouru, Aruna, MD   250 mg at 01/07/17 0920  . sertraline (ZOLOFT) tablet 100 mg  100 mg Oral QHS Gouru, Aruna, MD   100 mg at 01/06/17 2055     Discharge Medications: Medication List             TAKE these medications            ALPRAZolam 0.25 MG tablet Commonly known as:  XANAX Take 1 tablet (0.25 mg total) by mouth 2 (two) times daily. What changed:  when to take this    amoxicillin-clavulanate 875-125 MG tablet Commonly known as:  AUGMENTIN Take 1 tablet by mouth 2 (two) times daily.    arformoterol 15 MCG/2ML Nebu Commonly known as:  BROVANA Take 2 mLs (15 mcg total) by nebulization 2 (two) times daily.    aspirin EC 81 MG tablet Take 81 mg by mouth daily.    atorvastatin 20 MG tablet Commonly known as:  LIPITOR Take 20 mg by mouth at bedtime.    budesonide 0.5 MG/2ML nebulizer solution Commonly known as:  PULMICORT Take 2 mLs (0.5 mg  total) by nebulization 2 (two) times daily.    buPROPion 150 MG 24 hr tablet Commonly known as:  WELLBUTRIN XL Take 150 mg by mouth daily.    divalproex 250 MG DR tablet Commonly known as:  DEPAKOTE Take 1 tablet (250 mg total) by mouth every 12 (twelve) hours.    donepezil 10 MG tablet Commonly known as:  ARICEPT Take 10 mg by mouth every morning.    ferrous sulfate 325 (65 FE) MG tablet Take 325 mg by mouth daily with breakfast.    fluticasone 50 MCG/ACT nasal spray Commonly known as:  FLONASE Place 2 sprays into both nostrils daily.    ipratropium-albuterol 0.5-2.5 (3) MG/3ML Soln Commonly known as:  DUONEB Inhale 3 mLs into the lungs every 4 (four) hours as needed.    memantine 10 MG tablet Commonly known as:  NAMENDA Take 10 mg by mouth 2 (two) times daily.    montelukast 10 MG tablet Commonly known as:  SINGULAIR Take 10 mg by mouth every morning.    nortriptyline 10 MG capsule Commonly known as:  PAMELOR Take 20 mg by mouth at bedtime.    omeprazole 20 MG capsule Commonly known as:  PRILOSEC Take 20 mg by mouth daily.    polyethylene glycol packet Commonly known as:  MIRALAX / GLYCOLAX Take 17 g by mouth daily as needed.    potassium chloride SA 20 MEQ tablet Commonly known as:  K-DUR,KLOR-CON Take 1 tablet (20 mEq total) by mouth daily. While on lasix    predniSONE 5 MG tablet Commonly known as:  DELTASONE Take 1 tablet (5 mg total) by mouth daily with breakfast. What changed:  Another medication with the same name was added. Make sure you understand how and when to take each.    predniSONE 10 MG tablet Commonly known as:  DELTASONE Label  & dispense according to the schedule below.  3 Pills PO for 1 day, 2 Pills PO for 1 day, 1 Pill PO for 1 days then STOP. What changed:  You were already taking a medication with the same name, and this prescription was added. Make sure you understand how and when to take each.    risperiDONE 0.5 MG  tablet Commonly known as:  RISPERDAL Take 0.5 mg by mouth 2 (two) times daily.    saccharomyces boulardii 250 MG capsule Commonly known as:  FLORASTOR Take 250 mg by mouth 2 (two) times daily.    sertraline 100 MG tablet Commonly known as:  ZOLOFT Take 100 mg by mouth at bedtime.       Relevant Imaging Results:  Relevant Lab Results:   Additional Information  SS#[id number]  Judi Cong, LCSW

## 2017-01-07 NOTE — Progress Notes (Signed)
[employer] - [place] at [facility]   PATIENT NAME: Jessica Webster    MR#:  [id number]  DATE OF BIRTH:  May 17, 1945  SUBJECTIVE:   Pt. Here due to shortness of breath due to suspected aspiration pneumonia. Non-verbal due to advanced dementia.  She shortness of breath and wheezing significantly improved since admission. Patient's husband is at bedside and her oral intake is improved and she has clinically improved according to him.   REVIEW OF SYSTEMS:    Review of Systems  Unable to perform ROS: Dementia    Nutrition: Dysphagia 1 with nectar thick liquids Tolerating Diet: Some  Tolerating PT: Await Eval.    DRUG ALLERGIES:   Allergies  Allergen Reactions  . Erythromycin Hives  . Sulfa Antibiotics Hives  . Vancomycin Rash    Noted redness at IV site during infusion.     VITALS:  Blood pressure 133/64, pulse (!) 102, temperature 98.9 F (37.2 C), temperature source Oral, resp. rate 17, height  (1.702 m), weight 69.5 kg (153 lb 3.2 oz), SpO2 93 %.  PHYSICAL EXAMINATION:   Physical Exam  GENERAL:  71 y.o.-year-old patient lying in bed in no acute distress.  EYES: Pupils equal, round, reactive to light and accommodation. No scleral icterus. Extraocular muscles intact.  HEENT: Head atraumatic, normocephalic. Oropharynx and nasopharynx clear.  NECK:  Supple, no jugular venous distention. No thyroid enlargement, no tenderness.  LUNGS: Poor Resp. Effort, no wheezing, rales, rhonchi. No use of accessory muscles of respiration.  CARDIOVASCULAR: S1, S2 normal. No murmurs, rubs, or gallops.  ABDOMEN: Soft, nontender, nondistended. Bowel sounds present. No organomegaly or mass.  EXTREMITIES: No cyanosis, clubbing or edema b/l.    NEUROLOGIC: Cranial nerves II through XII are intact. No focal Motor or sensory deficits b/l.  Globally weak PSYCHIATRIC: The patient is alert and oriented x 1.  SKIN: No obvious rash, lesion, or ulcer.    LABORATORY PANEL:    CBC  Recent Labs Lab 01/06/17 0414  WBC 6.5  HGB 13.1  HCT 39.6  PLT 215   ------------------------------------------------------------------------------------------------------------------  Chemistries   Recent Labs Lab 01/06/17 0414  NA 142  K 4.7  CL 106  CO2 25  GLUCOSE 159*  BUN 17  CREATININE 0.87  CALCIUM 8.8*  AST 35  ALT 20  ALKPHOS 70  BILITOT 0.5   ------------------------------------------------------------------------------------------------------------------  Cardiac Enzymes  Recent Labs Lab 01/05/17 1341  TROPONINI <0.03   ------------------------------------------------------------------------------------------------------------------  RADIOLOGY:  Dg Chest 1 View  Result Date: 01/05/2017 CLINICAL DATA:  71 y.o. female with a known history of COPD, alzheimers, dementia, hypertension, hyperlipidemia admitted today for SOB. Former smoker EXAM: CHEST 1 VIEW COMPARISON:  01/05/2017 at 10:32 a.m. FINDINGS: Cardiac silhouette is normal in size and configuration. There is a small hiatal hernia. No mediastinal or hilar masses. No convincing adenopathy. Lungs are mildly hyperexpanded but clear. No convincing pleural effusion. No pneumothorax. Skeletal structures are grossly intact. IMPRESSION: No acute cardiopulmonary disease. Electronically Signed   By: Amie Portland M.D.   On: 01/05/2017 19:04     ASSESSMENT AND PLAN:   71 yo female w/ hx of HTN, Anxiety, Asthma, Alzheimer's dementia who presented to the hospital from memory care due to shortness of breath and wheezing.  1. Acute respiratory failure with hypoxia-secondary to aspiration pneumonitis. - cont. To improve and will taper IV steroids to Oral Pred. - continue aggressive pulmonary toileting, continue duo nebs scheduled - wean O2 as tolerated.   2. Aspiration Pneumonitis - improving. Taper  IV steroids to Oral Pred.  - cont. Duonebs, Brovana - improving. Appreciate Speech eval and cont.  Dysphagia 1 with nectar thick liquids  3. Anxiety/depression - cont. Xanax, Wellbutrin.   4. Dementia w/ behavioral disturbanc - cont. Aricept.  - cont. Depakote, Risperdal.   5. GERD - cont. Protonix.   6. Hyperlipidemia - cont. Atorvastatin.   Improving and likely d/c to Group home tomorrow. Discussed w/ Husband.   All the records are reviewed and case discussed with Care Management/Social Worker. Management plans discussed with the patient, family and they are in agreement.  CODE STATUS: DNR  DVT Prophylaxis: Lovenox  TOTAL TIME TAKING CARE OF THIS PATIENT: 30 minutes.   POSSIBLE D/C IN 1-2 DAYS, DEPENDING ON CLINICAL CONDITION.   Houston Siren M.D on 01/07/2017 at 12:56 PM  Between 7am to 6pm - Pager - [phone]  After 6pm go to [email or web address] - Therapist, nutritional Hospitalists  Office  [phone]  CC: Primary care physician; [facility]

## 2017-01-08 MED ORDER — AMOXICILLIN-POT CLAVULANATE 875-125 MG PO TABS: 1.0000 | ORAL_TABLET | Freq: Two times a day (BID) | ORAL | 0 refills | Status: AC

## 2017-01-08 MED ORDER — ALPRAZOLAM 0.25 MG PO TABS: 0.2500 mg | ORAL_TABLET | Freq: Two times a day (BID) | ORAL | 0 refills | Status: AC

## 2017-01-08 MED ORDER — PREDNISONE 10 MG PO TABS: ORAL_TABLET | ORAL | 0 refills | Status: DC

## 2017-01-08 NOTE — Clinical Social Work Note (Signed)
The patient will discharge to [facility] Unit via family transport by her daughter. The patient's husband and the facility are aware and in agreement. All paperwork has been sent to the facility, and the packet is on the chart. The attending RN is aware that she does not have to call report. CSW will continue to follow pending additional discharge needs.  Argentina Ponder, MSW, Theresia Majors [phone]

## 2017-01-08 NOTE — Care Management Note (Signed)
Case Management Note  Patient Details  Name: Makya Phillis MRN: [id number] Date of Birth: [birth year]  Subjective/Objective:   A resumption of care was called to [facility] at [facility]  for RN services at [facility].                  Action/Plan:   Expected Discharge Date:  01/08/17               Expected Discharge Plan:     In-House Referral:     Discharge planning Services     Post Acute Care Choice:    Choice offered to:     DME Arranged:    DME Agency:     HH Arranged:    HH Agency:     Status of Service:     If discussed at [employer], dates discussed:    Additional Comments:  Jyair Kiraly A, RN 01/08/2017, 10:01 AM

## 2017-01-08 NOTE — Progress Notes (Signed)
Patient discharge teaching given to patient and patient's family, including activity, diet, follow-up appoints, and medications. Patient verbalized understanding of all discharge instructions. IV access was d/c'd. Vitals are stable. Skin is intact except as charted in most recent assessments. RN notified [facility] about pt arriving soon and report given to receiving nurse. Pt to be escorted out by NT and RN, to be driven to [employer] by family.  Audrena Talaga [employer]

## 2017-01-08 NOTE — Discharge Summary (Signed)
[employer] - [place] at [facility]   PATIENT NAME: Jessica Webster    MR#:  [id number]  DATE OF BIRTH:  03/28/1946  DATE OF ADMISSION:  01/05/2017 ADMITTING PHYSICIAN: Ramonita Lab, MD  DATE OF DISCHARGE: 01/08/2017  PRIMARY CARE PHYSICIAN: [facility]    ADMISSION DIAGNOSIS:  COPD exacerbation (HCC) [J44.1]  DISCHARGE DIAGNOSIS:  Active Problems:   COPD with acute exacerbation (HCC)   SECONDARY DIAGNOSIS:   Past Medical History:  Diagnosis Date  . Alzheimer disease   . Anxiety   . Asthma   . Hypertension     HOSPITAL COURSE:   71 yo female w/ hx of HTN, Anxiety, Asthma, Alzheimer's dementia who presented to the hospital from memory care due to shortness of breath and wheezing.  1. Acute respiratory failure with hypoxia- this was secondary to aspiration pneumonitis. - pt. Was admitted to hospital and started on IV steroids, duonebs and has improved. She has no more wheezing and bronchospasm.  CXR (-) for any pneumonia but empirically being discharged on Oral Augmentin.  - now being discharged on Oral Pred. Taper.   - seen by Speech and started on Dysphagia 1 diet with nectar thick liquids and tolerating it well.   2. Aspiration Pneumonitis - treated with IV steroids, duonebs, and has improved. CXR (-) for acute pneumonia.  - now being discharged on Oral Pred. Taper and empiric Augmentin.    - she will cont. Her Pulmicort nebs, duonebs and Brovana - seen by Speech and tolerating a  Dysphagia 1 with nectar thick liquids  3. Anxiety/depression - She will cont. Xanax, Wellbutrin.   4. Dementia w/ behavioral disturbanc - she will cont. Aricept.  - she will cont. Depakote, Risperdal.   5. GERD - she will cont. Omeprazole.   6. Hyperlipidemia - she will cont. Atorvastatin.   She is being discharged back to her Group home.   DISCHARGE CONDITIONS:   Stable.   CONSULTS OBTAINED:    DRUG ALLERGIES:   Allergies  Allergen Reactions   . Erythromycin Hives  . Sulfa Antibiotics Hives  . Vancomycin Rash    Noted redness at IV site during infusion.     DISCHARGE MEDICATIONS:   Allergies as of 01/08/2017      Reactions   Erythromycin Hives   Sulfa Antibiotics Hives   Vancomycin Rash   Noted redness at IV site during infusion.       Medication List    TAKE these medications   ALPRAZolam 0.25 MG tablet Commonly known as:  XANAX Take 1 tablet (0.25 mg total) by mouth 2 (two) times daily. What changed:  when to take this   amoxicillin-clavulanate 875-125 MG tablet Commonly known as:  AUGMENTIN Take 1 tablet by mouth 2 (two) times daily.   arformoterol 15 MCG/2ML Nebu Commonly known as:  BROVANA Take 2 mLs (15 mcg total) by nebulization 2 (two) times daily.   aspirin EC 81 MG tablet Take 81 mg by mouth daily.   atorvastatin 20 MG tablet Commonly known as:  LIPITOR Take 20 mg by mouth at bedtime.   budesonide 0.5 MG/2ML nebulizer solution Commonly known as:  PULMICORT Take 2 mLs (0.5 mg total) by nebulization 2 (two) times daily.   buPROPion 150 MG 24 hr tablet Commonly known as:  WELLBUTRIN XL Take 150 mg by mouth daily.   divalproex 250 MG DR tablet Commonly known as:  DEPAKOTE Take 1 tablet (250 mg total) by mouth every 12 (twelve) hours.  donepezil 10 MG tablet Commonly known as:  ARICEPT Take 10 mg by mouth every morning.   ferrous sulfate 325 (65 FE) MG tablet Take 325 mg by mouth daily with breakfast.   fluticasone 50 MCG/ACT nasal spray Commonly known as:  FLONASE Place 2 sprays into both nostrils daily.   ipratropium-albuterol 0.5-2.5 (3) MG/3ML Soln Commonly known as:  DUONEB Inhale 3 mLs into the lungs every 4 (four) hours as needed.   memantine 10 MG tablet Commonly known as:  NAMENDA Take 10 mg by mouth 2 (two) times daily.   montelukast 10 MG tablet Commonly known as:  SINGULAIR Take 10 mg by mouth every morning.   nortriptyline 10 MG capsule Commonly known as:   PAMELOR Take 20 mg by mouth at bedtime.   omeprazole 20 MG capsule Commonly known as:  PRILOSEC Take 20 mg by mouth daily.   polyethylene glycol packet Commonly known as:  MIRALAX / GLYCOLAX Take 17 g by mouth daily as needed.   potassium chloride SA 20 MEQ tablet Commonly known as:  K-DUR,KLOR-CON Take 1 tablet (20 mEq total) by mouth daily. While on lasix   predniSONE 5 MG tablet Commonly known as:  DELTASONE Take 1 tablet (5 mg total) by mouth daily with breakfast. What changed:  Another medication with the same name was added. Make sure you understand how and when to take each.   predniSONE 10 MG tablet Commonly known as:  DELTASONE Label  & dispense according to the schedule below.  3 Pills PO for 1 day, 2 Pills PO for 1 day, 1 Pill PO for 1 days then STOP. What changed:  You were already taking a medication with the same name, and this prescription was added. Make sure you understand how and when to take each.   risperiDONE 0.5 MG tablet Commonly known as:  RISPERDAL Take 0.5 mg by mouth 2 (two) times daily.   saccharomyces boulardii 250 MG capsule Commonly known as:  FLORASTOR Take 250 mg by mouth 2 (two) times daily.   sertraline 100 MG tablet Commonly known as:  ZOLOFT Take 100 mg by mouth at bedtime.            Discharge Care Instructions        Start     Ordered   01/08/17 0000  ALPRAZolam (XANAX) 0.25 MG tablet  2 times daily     01/08/17 0952   01/08/17 0000  predniSONE (DELTASONE) 10 MG tablet     01/08/17 0952   01/08/17 0000  amoxicillin-clavulanate (AUGMENTIN) 875-125 MG tablet  2 times daily     01/08/17 0952   01/08/17 0000  Activity as tolerated - No restrictions     01/08/17 0952   01/08/17 0000  Diet general    Comments:  Pureed diet with Nectar Thick liquids with aspiration Precautions.   01/08/17 0952        DISCHARGE INSTRUCTIONS:   DIET:  Regular diet  Dysphagia 1 diet with nectar thick liquids and aspiration precautions.  Will need speech to follow pt.   DISCHARGE CONDITION:  Stable  ACTIVITY:  Activity as tolerated  OXYGEN:  Home Oxygen: No.   Oxygen Delivery: room air  DISCHARGE LOCATION:  group home   If you experience worsening of your admission symptoms, develop shortness of breath, life threatening emergency, suicidal or homicidal thoughts you must seek medical attention immediately by calling 911 or calling your MD immediately  if symptoms less severe.  You Must read complete instructions/literature  along with all the possible adverse reactions/side effects for all the Medicines you take and that have been prescribed to you. Take any new Medicines after you have completely understood and accpet all the possible adverse reactions/side effects.   Please note  You were cared for by a hospitalist during your hospital stay. If you have any questions about your discharge medications or the care you received while you were in the hospital after you are discharged, you can call the unit and asked to speak with the hospitalist on call if the hospitalist that took care of you is not available. Once you are discharged, your primary care physician will handle any further medical issues. Please note that NO REFILLS for any discharge medications will be authorized once you are discharged, as it is imperative that you return to your primary care physician (or establish a relationship with a primary care physician if you do not have one) for your aftercare needs so that they can reassess your need for medications and monitor your lab values.     Today   No acute events overnight. REsp. Status stable.  Husband at bedside.  No complaints.   VITAL SIGNS:  Blood pressure 121/67, pulse 70, temperature 97.6 F (36.4 C), temperature source Axillary, resp. rate 16, height  (1.702 m), weight 69.5 kg (153 lb 3.2 oz), SpO2 96 %.  I/O:   Intake/Output Summary (Last 24 hours) at 01/08/17 0952 Last data filed at  01/07/17 2147  Gross per 24 hour  Intake                0 ml  Output              200 ml  Net             -200 ml    PHYSICAL EXAMINATION:   GENERAL:  71 y.o.-year-old patient lying in bed in no acute distress.  EYES: Pupils equal, round, reactive to light and accommodation. No scleral icterus. Extraocular muscles intact.  HEENT: Head atraumatic, normocephalic. Oropharynx and nasopharynx clear.  NECK:  Supple, no jugular venous distention. No thyroid enlargement, no tenderness.  LUNGS: Poor Resp. Effort, no wheezing, rales, rhonchi. No use of accessory muscles of respiration.  CARDIOVASCULAR: S1, S2 normal. No murmurs, rubs, or gallops.  ABDOMEN: Soft, nontender, nondistended. Bowel sounds present. No organomegaly or mass.  EXTREMITIES: No cyanosis, clubbing or edema b/l.    NEUROLOGIC: Cranial nerves II through XII are intact. No focal Motor or sensory deficits b/l.  Globally weak PSYCHIATRIC: The patient is alert and oriented x 1.  SKIN: No obvious rash, lesion, or ulcer.   DATA REVIEW:   CBC  Recent Labs Lab 01/06/17 0414  WBC 6.5  HGB 13.1  HCT 39.6  PLT 215    Chemistries   Recent Labs Lab 01/06/17 0414  NA 142  K 4.7  CL 106  CO2 25  GLUCOSE 159*  BUN 17  CREATININE 0.87  CALCIUM 8.8*  AST 35  ALT 20  ALKPHOS 70  BILITOT 0.5    Cardiac Enzymes  Recent Labs Lab 01/05/17 1341  TROPONINI <0.03    Microbiology Results  Results for orders placed or performed during the hospital encounter of 01/05/17  MRSA PCR Screening     Status: Abnormal   Collection Time: 01/06/17  8:59 AM  Result Value Ref Range Status   MRSA by PCR POSITIVE (A) NEGATIVE Final    Comment:  The GeneXpert MRSA Assay (FDA approved for NASAL specimens only), is one component of a comprehensive MRSA colonization surveillance program. It is not intended to diagnose MRSA infection nor to guide or monitor treatment for MRSA infections. RESULT CALLED TO, READ BACK BY AND  VERIFIED WITH: Mercer Pod RN AT 1050 01/06/17. MSS     RADIOLOGY:  No results found.    Management plans discussed with the patient, family and they are in agreement.  CODE STATUS:     Code Status Orders        Start     Ordered   01/05/17 1651  Do not attempt resuscitation (DNR)  Continuous    Question Answer Comment  In the event of cardiac or respiratory ARREST Do not call a "code blue"   In the event of cardiac or respiratory ARREST Do not perform Intubation, CPR, defibrillation or ACLS   In the event of cardiac or respiratory ARREST Use medication by any route, position, wound care, and other measures to relive pain and suffering. May use oxygen, suction and manual treatment of airway obstruction as needed for comfort.   Comments RN may pronounce      01/05/17 1650  Advance Directive Documentation     Most Recent Value  Type of Advance Directive  Healthcare Power of Attorney  Pre-existing out of facility DNR order (yellow form or pink MOST form)  -  "MOST" Form in Place?  -      TOTAL TIME TAKING CARE OF THIS PATIENT: 40 minutes.    Houston Siren M.D on 01/08/2017 at 9:52 AM  Between 7am to 6pm - Pager - [phone]  After 6pm go to [email or web address] - Therapist, nutritional Hospitalists  Office  [phone]  CC: Primary care physician; [facility]

## 2017-01-16 ENCOUNTER — Ambulatory Visit (INDEPENDENT_AMBULATORY_CARE_PROVIDER_SITE_OTHER): Payer: Medicare Other | Admitting: Pulmonary Disease

## 2017-01-16 ENCOUNTER — Encounter: Payer: Self-pay | Admitting: Pulmonary Disease

## 2017-01-16 VITALS — BP 114/68 | HR 91 | Resp 16 | Ht 67.0 in | Wt 153.0 lb

## 2017-01-16 DIAGNOSIS — J449 Chronic obstructive pulmonary disease, unspecified: Secondary | ICD-10-CM

## 2017-01-16 NOTE — Patient Instructions (Signed)
Continue nebulized Pulmicort and Brovana Continue prednisone at 5 mg per day Continue Duoneb as needed  Follow up in 3-4 months

## 2017-01-16 NOTE — Progress Notes (Signed)
PULMONARY OFFICE FOLLOW-UP NOTE  Requesting MD/Service: Burnett Sheng Date of initial consultation: 08/02/16 Reason for consultation: wheezing  PT PROFILE: 71 y.o. female former smoker with advanced Alzheimer's dementia and longstanding asthma with several months of persistent wheezing. On initial evaluation, it was felt that she was unable to effectively utilize metered dose inhalers. She was changed to nebulized steroid and long-acting beta agonist.  INTERVAL: Hospitalized 09/13-09/16 for acute dyspnea and wheezing.   SUBJ: Other than her recent hospitalization, she has been doing very well. During the recent hospitalization, she was discovered to be aspirating thin liquids and is now on thickened liquids. Pt's husband and daughter accompany her here today and report no new problems. Pt does continue to have intermittent rattling cough and intermittent audible wheezes though they think that this latter problem is much improved. Denies CP, fever, purulent sputum, hemoptysis, LE edema and calf tenderness   OBJ:  Vitals:   01/16/17 1317 01/16/17 1324  BP:  114/68  Pulse:  91  Resp: 16   SpO2:  97%  Weight: 69.4 kg (153 lb)   Height:  (1.702 m)   Room air   EXAM:  Gen:  NAD HEENT:  WNL Neck: Supple without LAN, JVD Lungs:  Scattered expiratory whees Cardiovascular: Reg, no murmurs noted Abdomen: Soft, nontender, normal BS Ext: Trace symmetric ankle edema  DATA:   BMP Latest Ref Rng & Units 01/06/2017 01/05/2017 11/28/2016  Glucose 65 - 99 mg/dL 161(W) 960(A) -  BUN 6 - 20 mg/dL 17 14 -  Creatinine 5.40 - 1.00 mg/dL 9.81 1.91 4.78  Sodium 135 - 145 mmol/L 142 140 -  Potassium 3.5 - 5.1 mmol/L 4.7 4.3 -  Chloride 101 - 111 mmol/L 106 104 -  CO2 22 - 32 mmol/L 25 26 -  Calcium 8.9 - 10.3 mg/dL 2.9(F) 6.2(Z) -    CBC Latest Ref Rng & Units 01/06/2017 01/05/2017 11/25/2016  WBC 3.6 - 11.0 K/uL 6.5 8.4 9.9  Hemoglobin 12.0 - 16.0 g/dL 30.8 65.7 11.0(L)  Hematocrit 35.0 - 47.0 %  39.6 42.2 34.0(L)  Platelets 150 - 440 K/uL 215 301 185    CXR  01/05/17:   No acute findings  IMPRESSION:     ICD-10-CM   1. Chronic obstructive airway disease with asthma (HCC) J44.9   2. Very advanced dementia       PLAN:  Cont prior regimen as follows: Montelukast 10 mg daily Budesonide 0.5 mg nebulized twice a day Arformoterol 15 mcg nebulized twice a day Duoneb - up to every four hours as needed for wheezing or shortness of breath Prednisone 5 mg daily  Follow-up in 3-4 months  Billy Fischer, MD PCCM service Mobile [phone] Pager [phone] 01/16/2017 1:59 PM

## 2017-03-20 ENCOUNTER — Telehealth: Payer: Self-pay | Admitting: Pulmonary Disease

## 2017-03-20 NOTE — Telephone Encounter (Signed)
Pt has had Wheezing and congestion. Doing duoneb Q6hrs and nursing home started Prednisone 40mg  x 7days and pt has 1 more day. Husband states hospice nurse is asking if we can increase the dosage on the Brovana and Pulmicort and keep it at twice daily. Offered to bring pt in also but husband doesn't want her seeing no one but you since she has dementia. Please advise.

## 2017-03-20 NOTE — Telephone Encounter (Signed)
Pt would like to increase her medication due to her COPD has increased, please call.

## 2017-03-21 NOTE — Telephone Encounter (Signed)
Brovana and Pulmicort should be twice a day. Rest of it sounds appropriate.  Jessica Webster

## 2017-03-21 NOTE — Telephone Encounter (Signed)
Informed husband of response and he states he will inform the hospice nurse. Nothing further needed.

## 2017-05-16 ENCOUNTER — Encounter: Payer: Self-pay | Admitting: Pulmonary Disease

## 2017-05-16 ENCOUNTER — Ambulatory Visit (INDEPENDENT_AMBULATORY_CARE_PROVIDER_SITE_OTHER): Admitting: Pulmonary Disease

## 2017-05-16 VITALS — BP 106/66 | HR 116 | Ht 67.0 in

## 2017-05-16 DIAGNOSIS — J4550 Severe persistent asthma, uncomplicated: Secondary | ICD-10-CM | POA: Diagnosis not present

## 2017-05-16 DIAGNOSIS — F0390 Unspecified dementia without behavioral disturbance: Secondary | ICD-10-CM | POA: Diagnosis not present

## 2017-05-16 DIAGNOSIS — F03C0 Unspecified dementia, severe, without behavioral disturbance, psychotic disturbance, mood disturbance, and anxiety: Secondary | ICD-10-CM

## 2017-05-16 NOTE — Progress Notes (Signed)
PULMONARY OFFICE FOLLOW-UP NOTE  Requesting MD/Service: Burnett ShengHedrick Date of initial consultation: 08/02/16 Reason for consultation: wheezing  PT PROFILE: 72 y.o. female former smoker with advanced Alzheimer's dementia and longstanding asthma with several months of persistent wheezing. On initial evaluation, it was felt that she was unable to effectively utilize metered dose inhalers. She was changed to nebulized steroid and long-acting beta agonist.  INTERVAL: Hospitalized 09/13-09/16 for acute dyspnea and wheezing.   SUBJ: This is a routine reevaluation.  Since last seen in September 2018, she has had progressive decline due to advanced dementia.  She has developed a seizure disorder.  She has undergone 2 prednisone tapers since last seen.  She is now maintained on prednisone 5 mg/day.  She continues on nebulized budesonide/arformoterol twice a day and has DuoNeb which she uses as needed.  Her family is seeking placement in a skilled nursing facility.  Hospice was initiated 2 months ago.  Her husband and daughter report intermittent pseudo-wheeze and occasional wheezes.  They are pretty good at distinguishing one from the other.  They note that the pseudo-wheeze seems to be more common after meals.  They report no CP, fever, purulent sputum, hemoptysis and calf tenderness.  She has mild chronic lower extremity edema which is improved from her recent baseline.  OBJ:  Vitals:   05/16/17 0841 05/16/17 0849  BP:  106/66  Pulse:  (!) 116  SpO2:  95%  Height: 5\' 7"  (1.702 m)   Room air   EXAM:  Gen:  NAD, mostly nonverbal HEENT:  WNL Neck: Supple without LAN, JVD Lungs: Clear to auscultation, no wheezes Cardiovascular: Reg, no murmurs noted Abdomen: Soft, nontender, normal BS Ext: Trace symmetric ankle and pretibial edema  DATA:   BMP Latest Ref Rng & Units 01/06/2017 01/05/2017 11/28/2016  Glucose 65 - 99 mg/dL 161(W159(H) 960(A105(H) -  BUN 6 - 20 mg/dL 17 14 -  Creatinine 5.400.44 - 1.00 mg/dL 9.810.87  1.910.75 4.780.79  Sodium 135 - 145 mmol/L 142 140 -  Potassium 3.5 - 5.1 mmol/L 4.7 4.3 -  Chloride 101 - 111 mmol/L 106 104 -  CO2 22 - 32 mmol/L 25 26 -  Calcium 8.9 - 10.3 mg/dL 2.9(F8.8(L) 6.2(Z8.6(L) -    CBC Latest Ref Rng & Units 01/06/2017 01/05/2017 11/25/2016  WBC 3.6 - 11.0 K/uL 6.5 8.4 9.9  Hemoglobin 12.0 - 16.0 g/dL 30.813.1 65.713.8 11.0(L)  Hematocrit 35.0 - 47.0 % 39.6 42.2 34.0(L)  Platelets 150 - 440 K/uL 215 301 185    CXR: No new film  IMPRESSION:     ICD-10-CM   1. Severe persistent asthma without complication J45.50   2. Advanced dementia F03.90     PLAN:  Continue Budesonide 0.5 mg and Arformoterol 15 mcg nebulized twice a day Continue Duoneb - up to every four hours as needed for wheezing or shortness of breath Continue prednisone 5 mg daily I instructed that it is probably okay to discontinue Singulair as it is unlikely that this medication is providing much benefit  Follow-up as needed  Billy Fischeravid Mazzie Brodrick, MD PCCM service Mobile [phone] Pager [phone] 05/16/2017 9:15 AM

## 2017-05-16 NOTE — Patient Instructions (Signed)
Continue Pulmicort (budesonide) and Brovana (arformoterol) nebulized twice a day Continue DuoNeb up to every 4 hours as needed for wheezing or shortness of breath Discontinue Singulair (montelukast) Continue prednisone at 5 mg/day Follow-up as needed

## 2017-05-19 ENCOUNTER — Ambulatory Visit: Payer: Medicare Other | Admitting: Pulmonary Disease

## 2017-05-26 DEATH — deceased

## 2017-05-31 IMAGING — US US ABDOMEN COMPLETE
1 series · 14 of 25 positions shown · non-contrast
Comparison: No recent prior.

CLINICAL DATA: Abdominal pain.

EXAM:
ABDOMEN ULTRASOUND COMPLETE

[Series 1: us abdomen complete · 0.22mm/px · 14 of 82 slices shown]
[im 1/82]
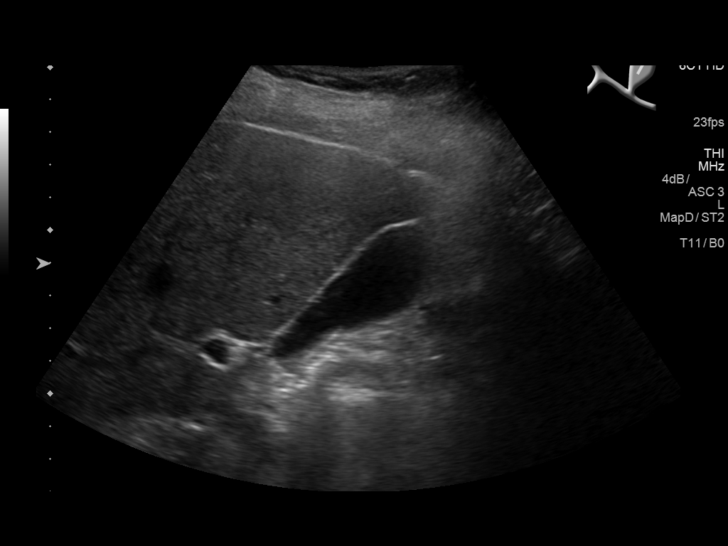
[im 7/82]
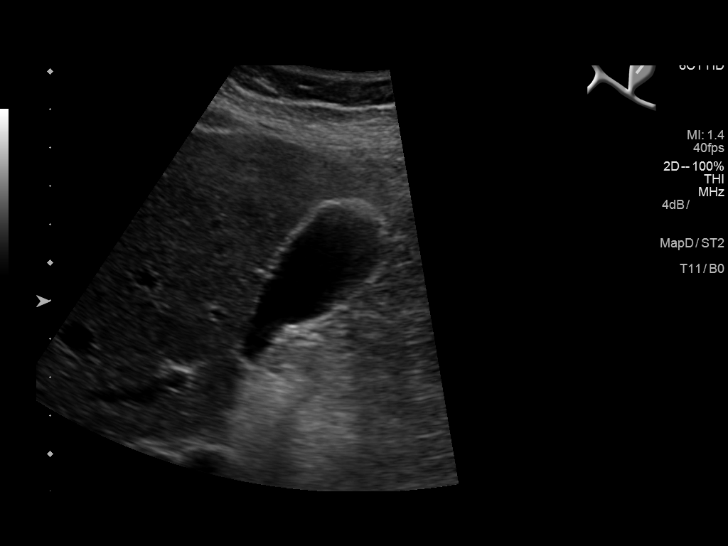
[im 14/82]
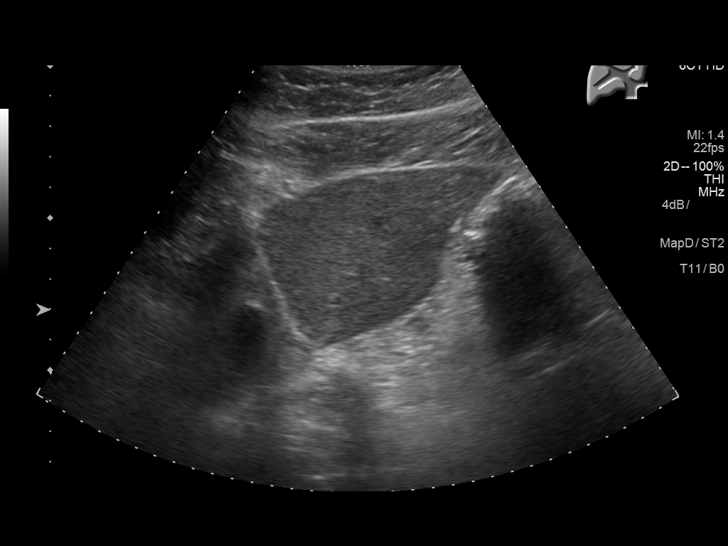
[im 21/82]
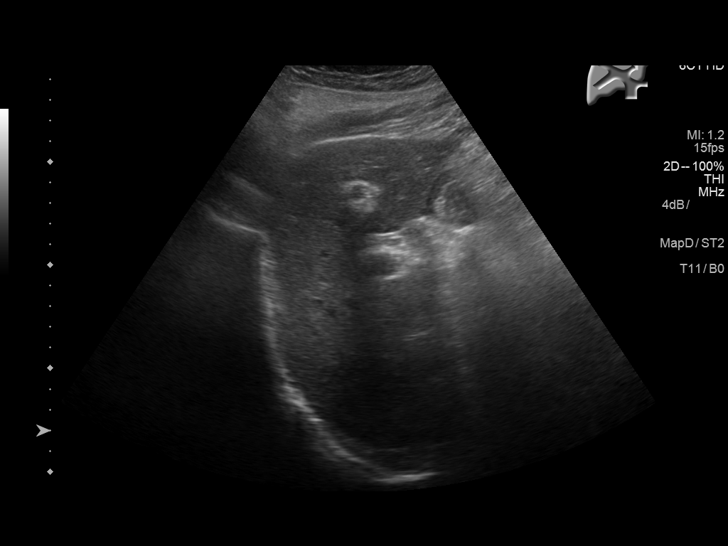
[im 28/82]
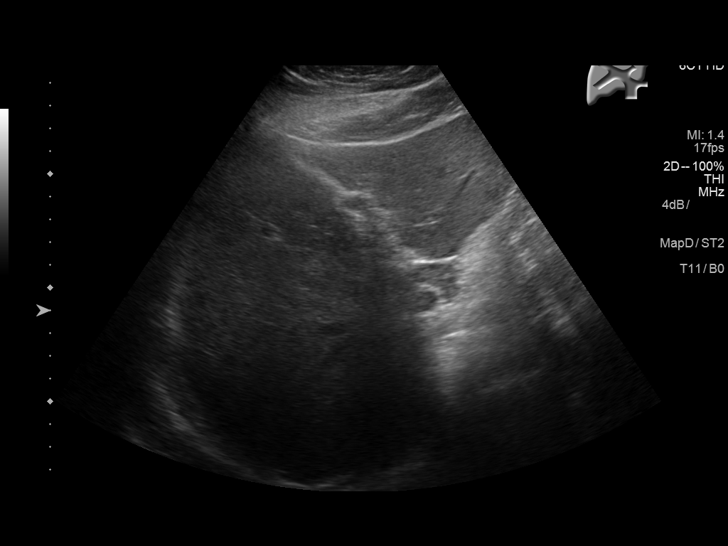
[im 31/82]
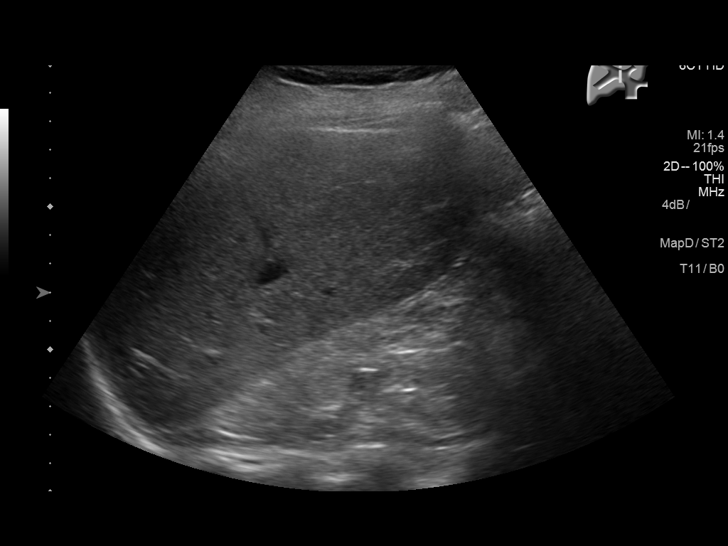
[im 38/82]
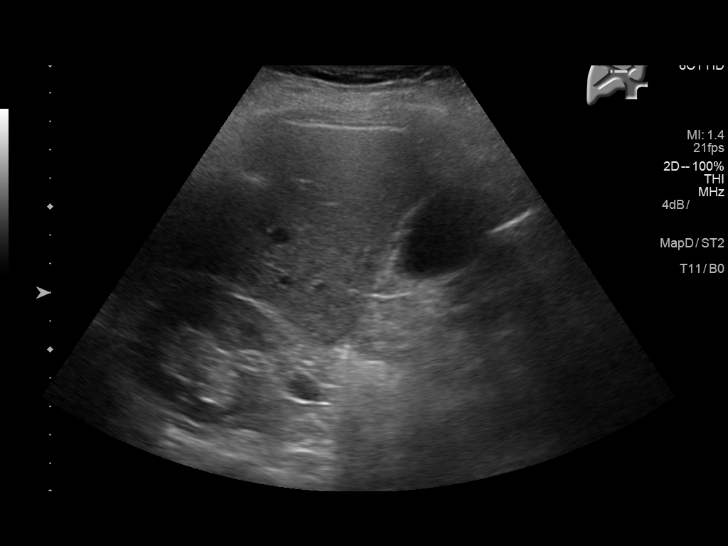
[im 44/82]
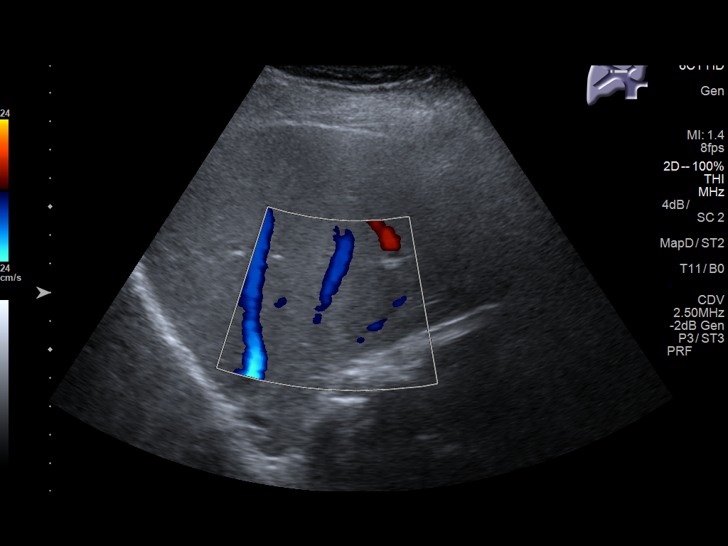
[im 51/82]
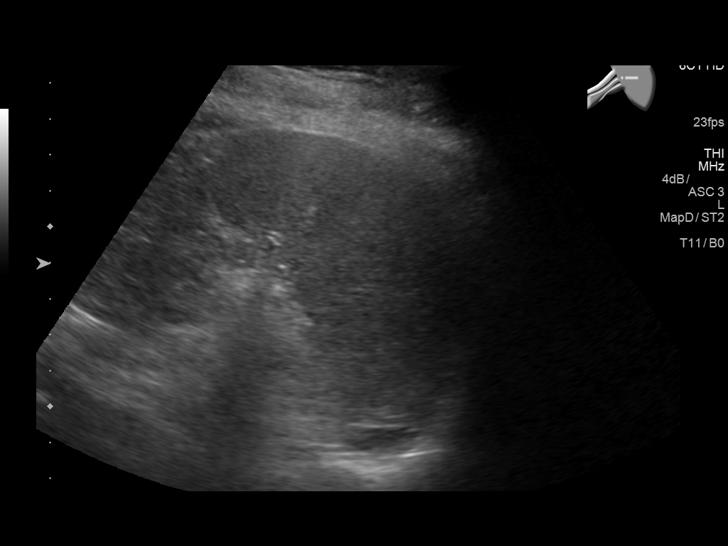
[im 55/82]
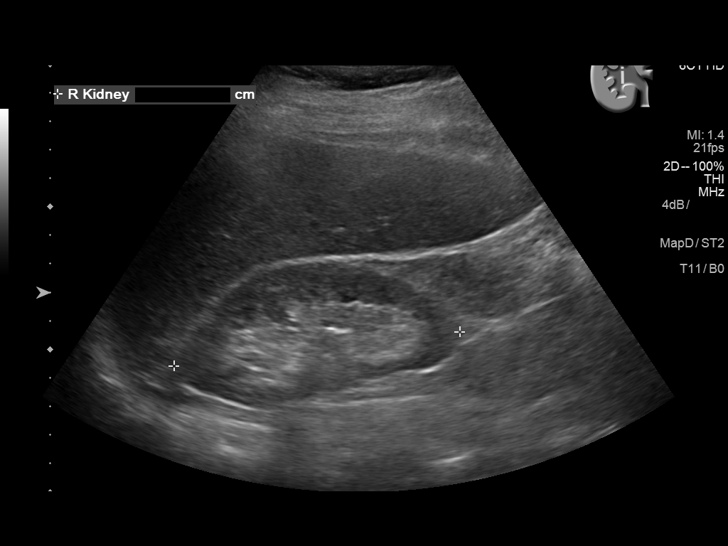
[im 61/82]
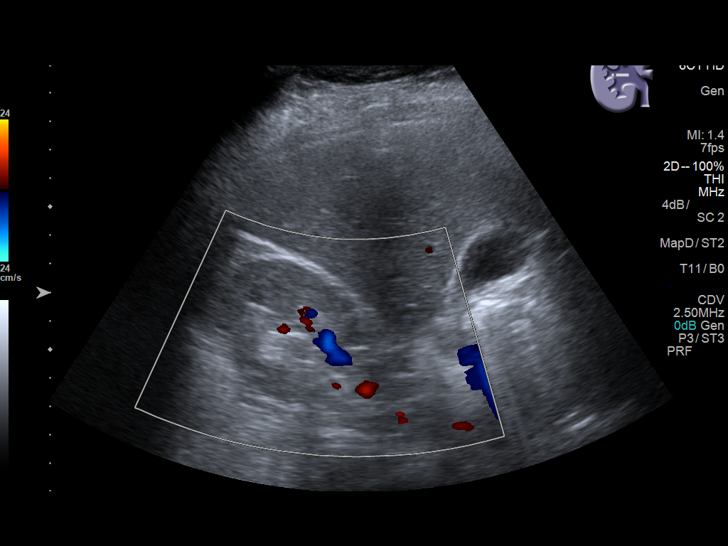
[im 68/82]
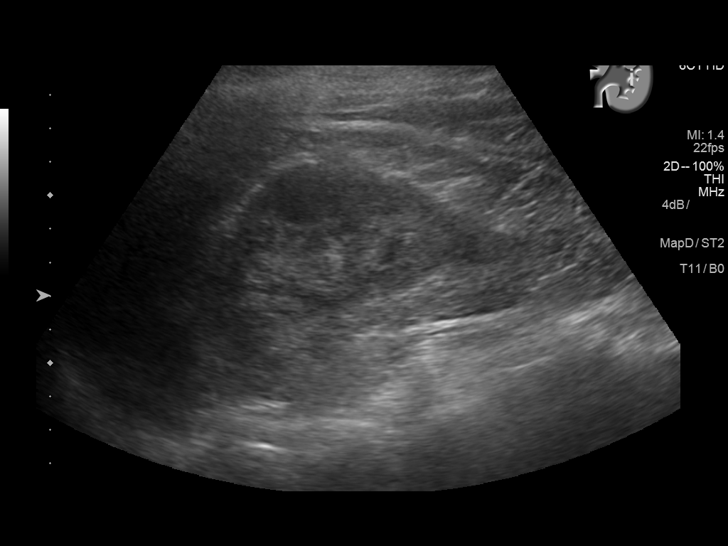
[im 75/82]
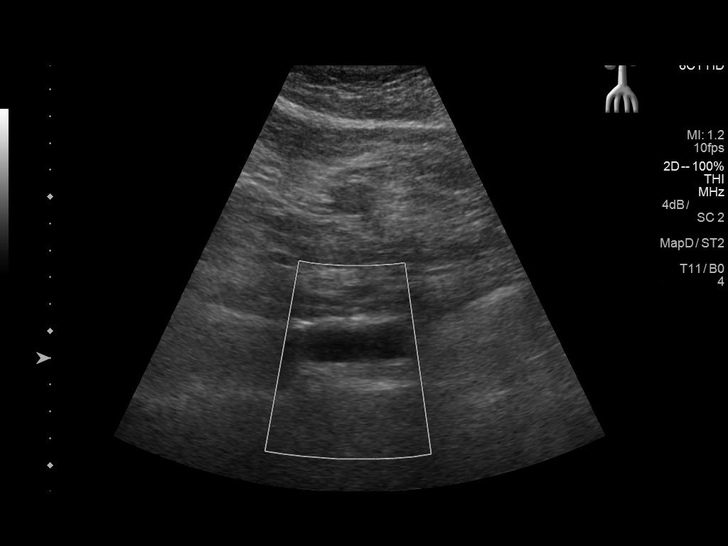
[im 82/82]
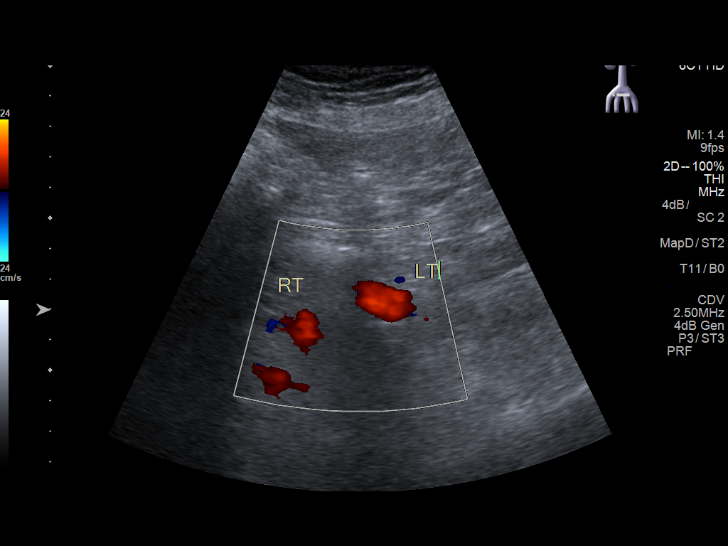

[14 of 25 positions shown; findings below may reference images not displayed]

FINDINGS: Gallbladder: No gallstones or wall thickening visualized. No
sonographic Murphy sign noted by sonographer.

Common bile duct: Diameter: 2.5 mm

Liver: No focal lesion identified. Within normal limits in
parenchymal echogenicity.

IVC: No abnormality visualized.

Pancreas: Visualized portion unremarkable.

Spleen: Size and appearance within normal limits.

Right Kidney: Length: 10.1 cm. Echogenicity within normal limits. No
mass or hydronephrosis visualized.

Left Kidney: Length: 9.3 cm. Echogenicity within normal limits. No
mass or hydronephrosis visualized.

Abdominal aorta: No aneurysm visualized.

Other findings: None.
IMPRESSION: Negative exam.

## 2019-01-27 IMAGING — CR DG CHEST 2V
2 series · 2 of 2 positions shown · non-contrast
Comparison: 05/05/2016

CLINICAL DATA: Chronic cough.

EXAM:
CHEST  2 VIEW

[chest pa]
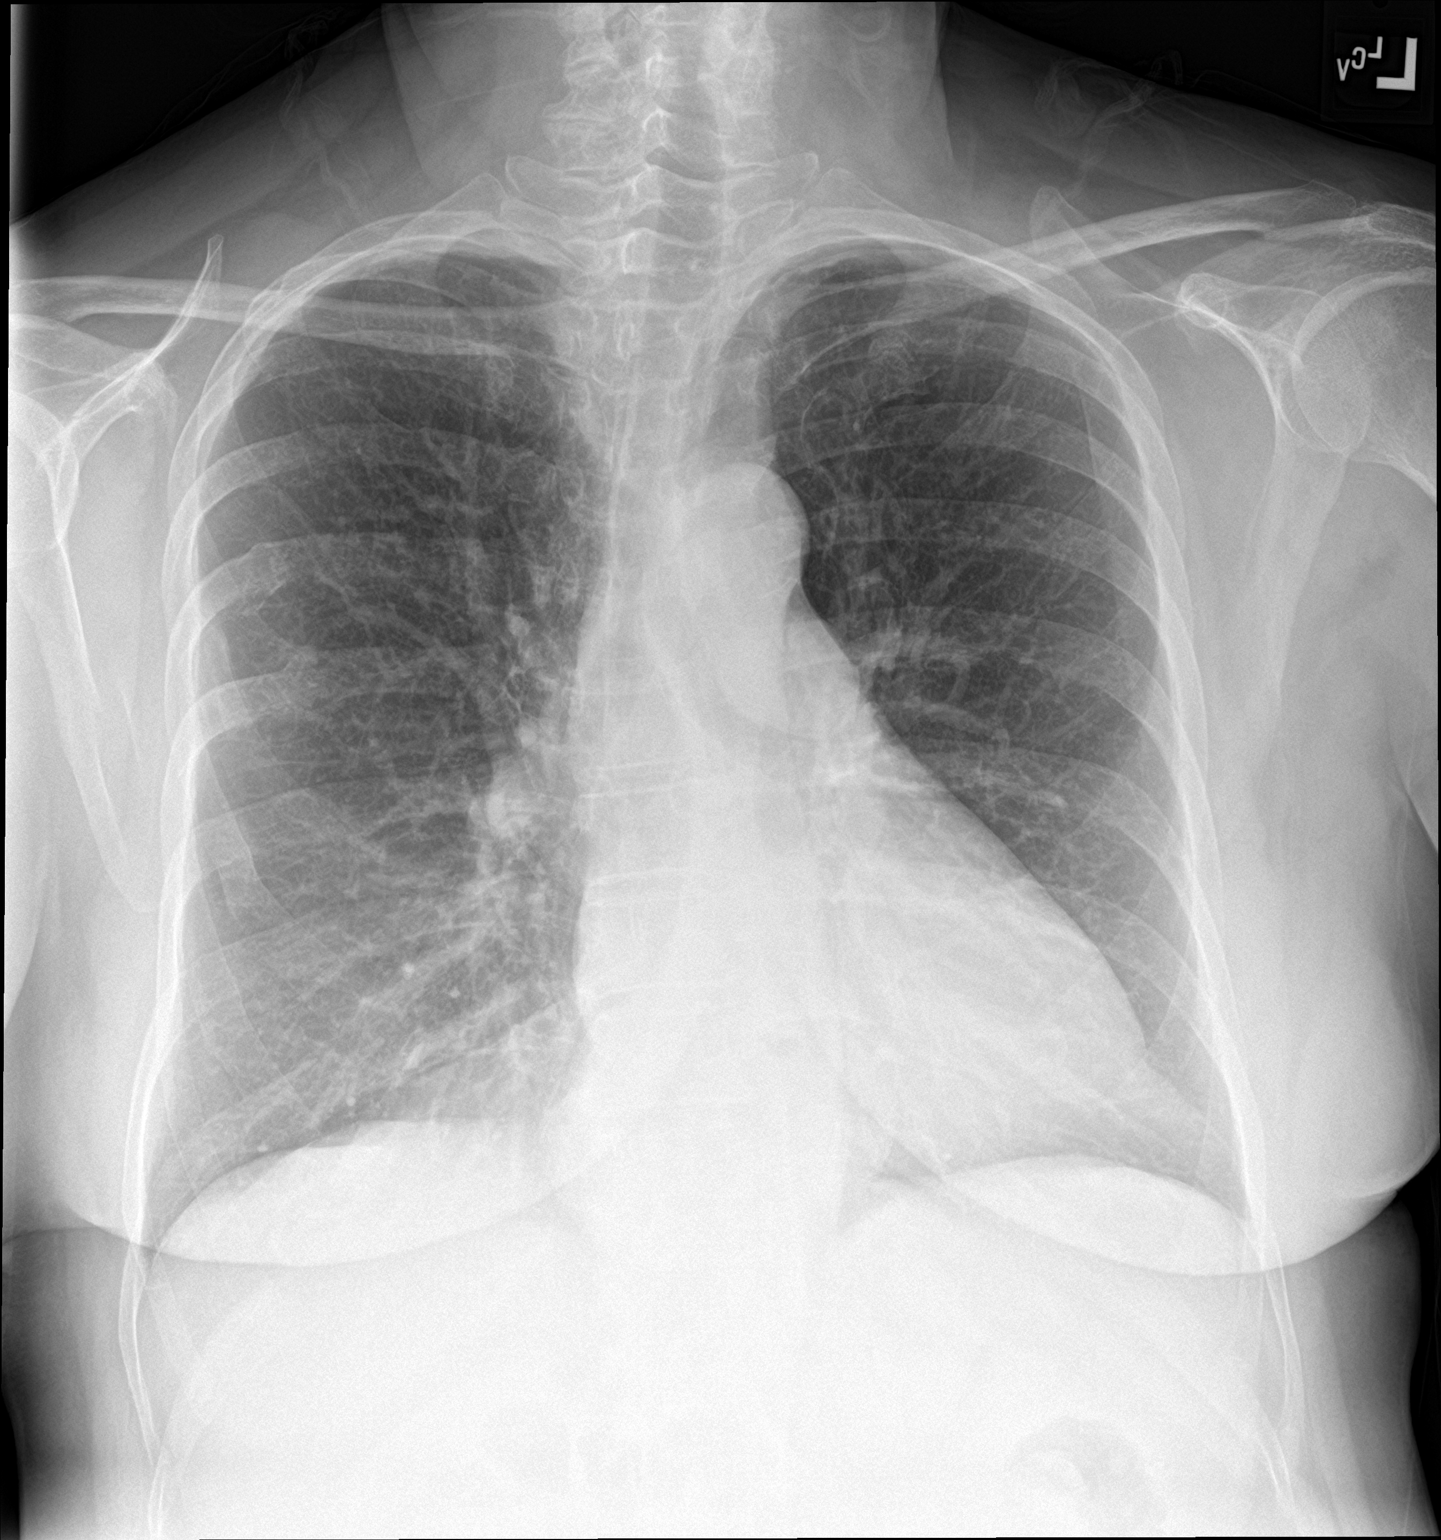

[chest lat]
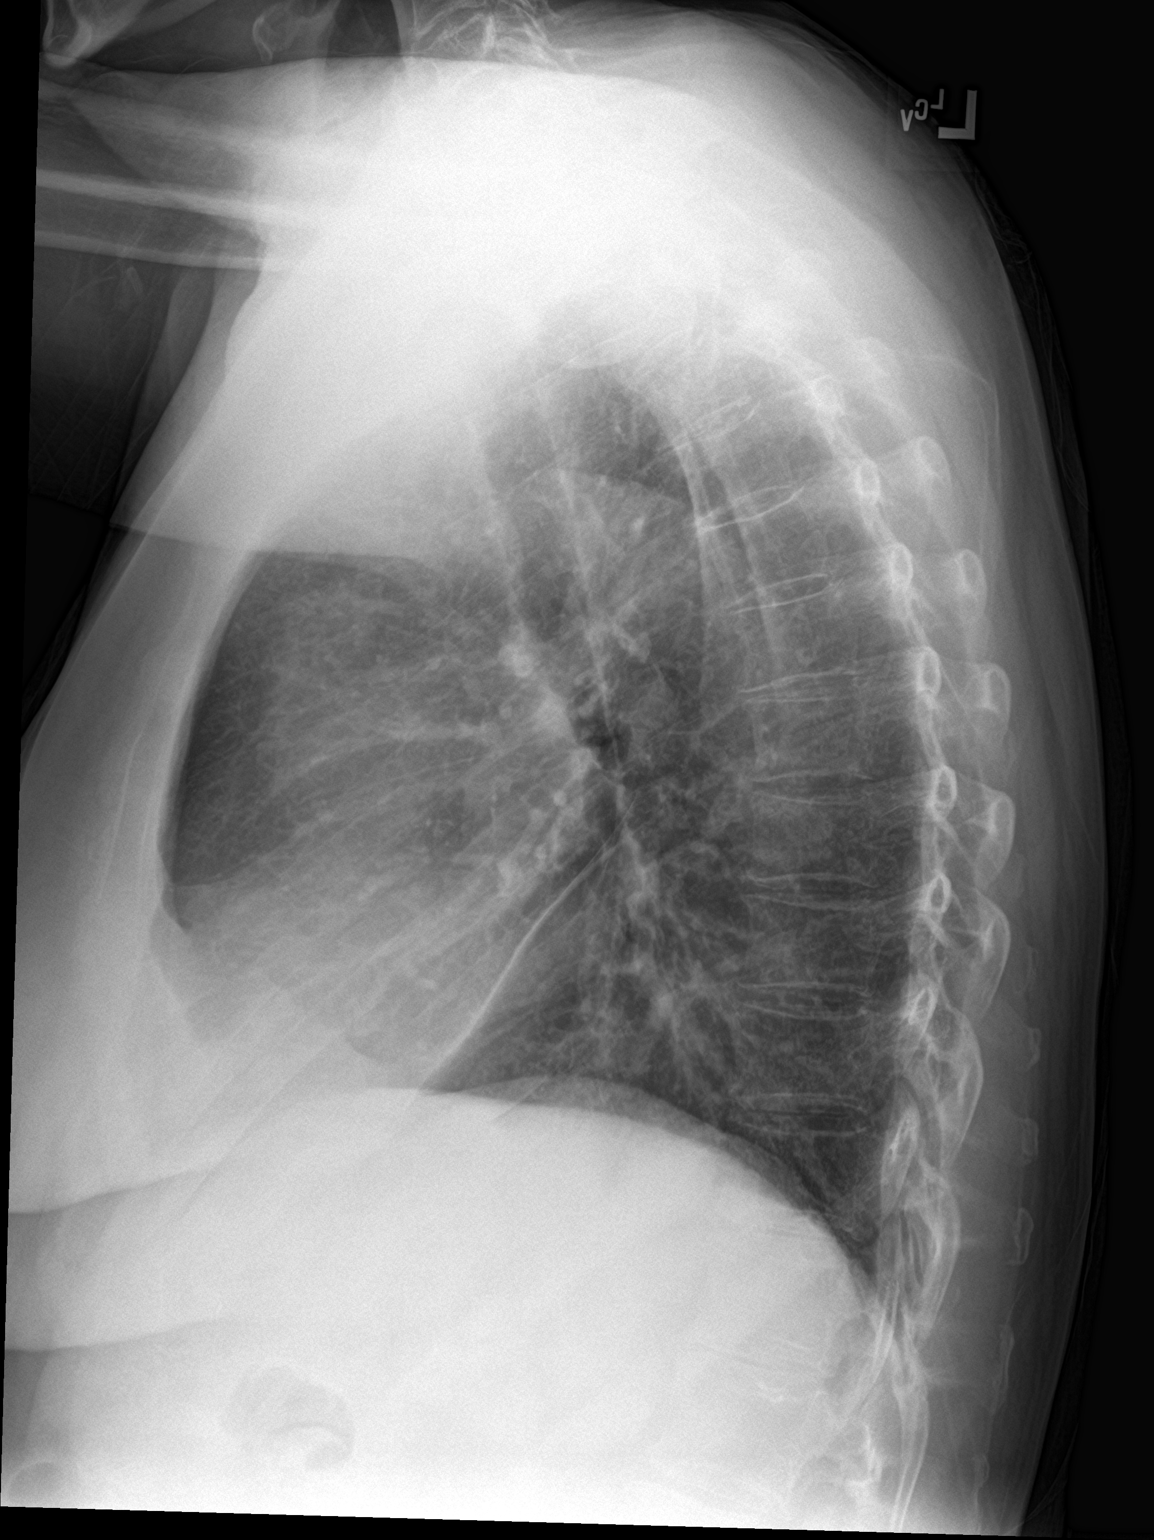

[2 of 2 positions shown; findings below may reference images not displayed]

FINDINGS: The heart size and pulmonary vascularity are normal. No infiltrates
or effusions. Moderate hiatal hernia. Multiple old right
posterolateral rib fractures. No acute bone abnormality. No
effusions.
IMPRESSION: No acute abnormality.  Moderate hiatal hernia.

## 2019-02-28 IMAGING — CR DG CHEST 2V
1 series · 2 of 2 positions shown · non-contrast
Comparison: 07/25/2016

CLINICAL DATA: Wheezing and shortness of breath for over a month
worse today, history bronchitis, asthma, hypertension, Alzheimer's

EXAM:
CHEST  2 VIEW

[Series 1: dg chest 2 view · 0.14mm/px · 2 of 2 slices shown]
[im 1/2]
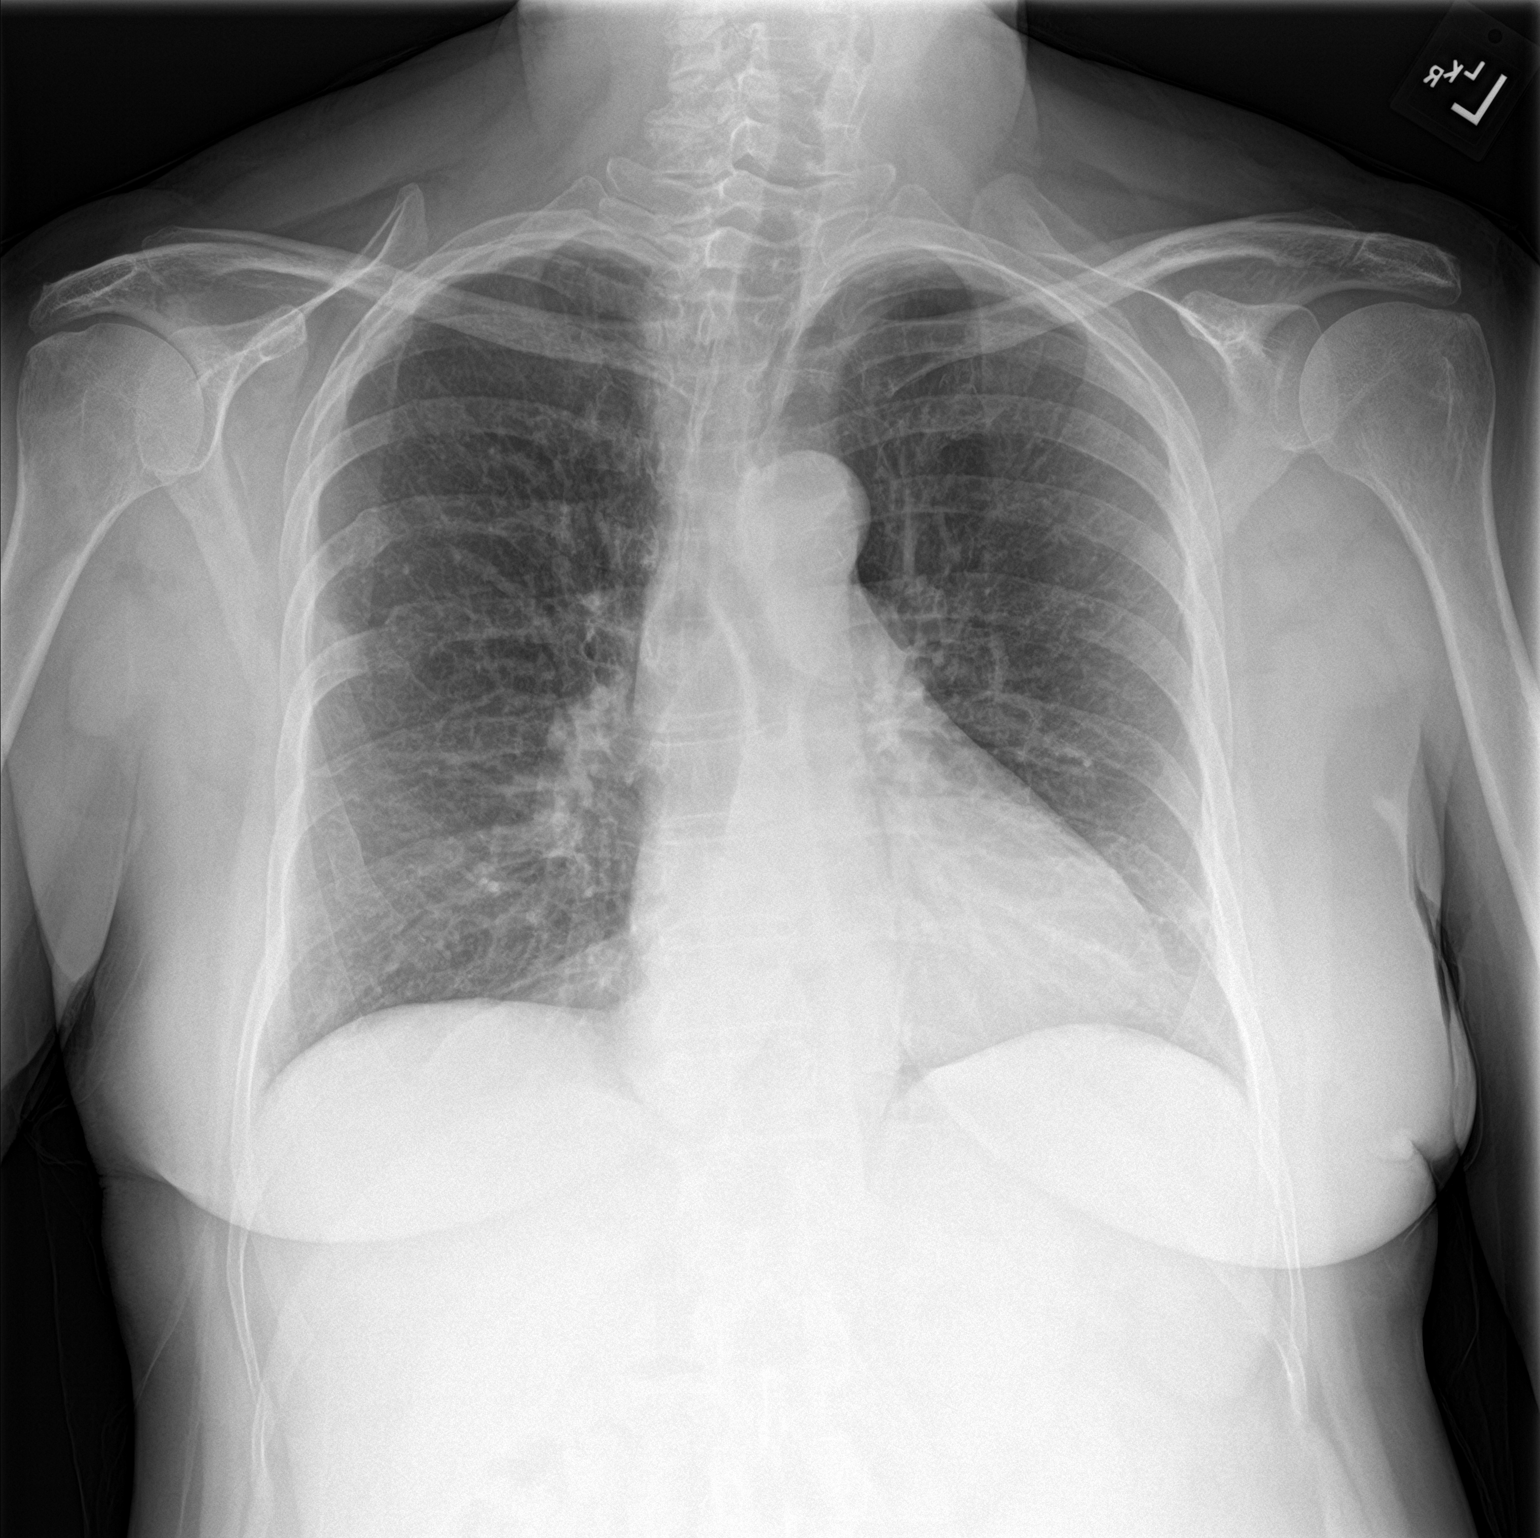
[im 2/2]
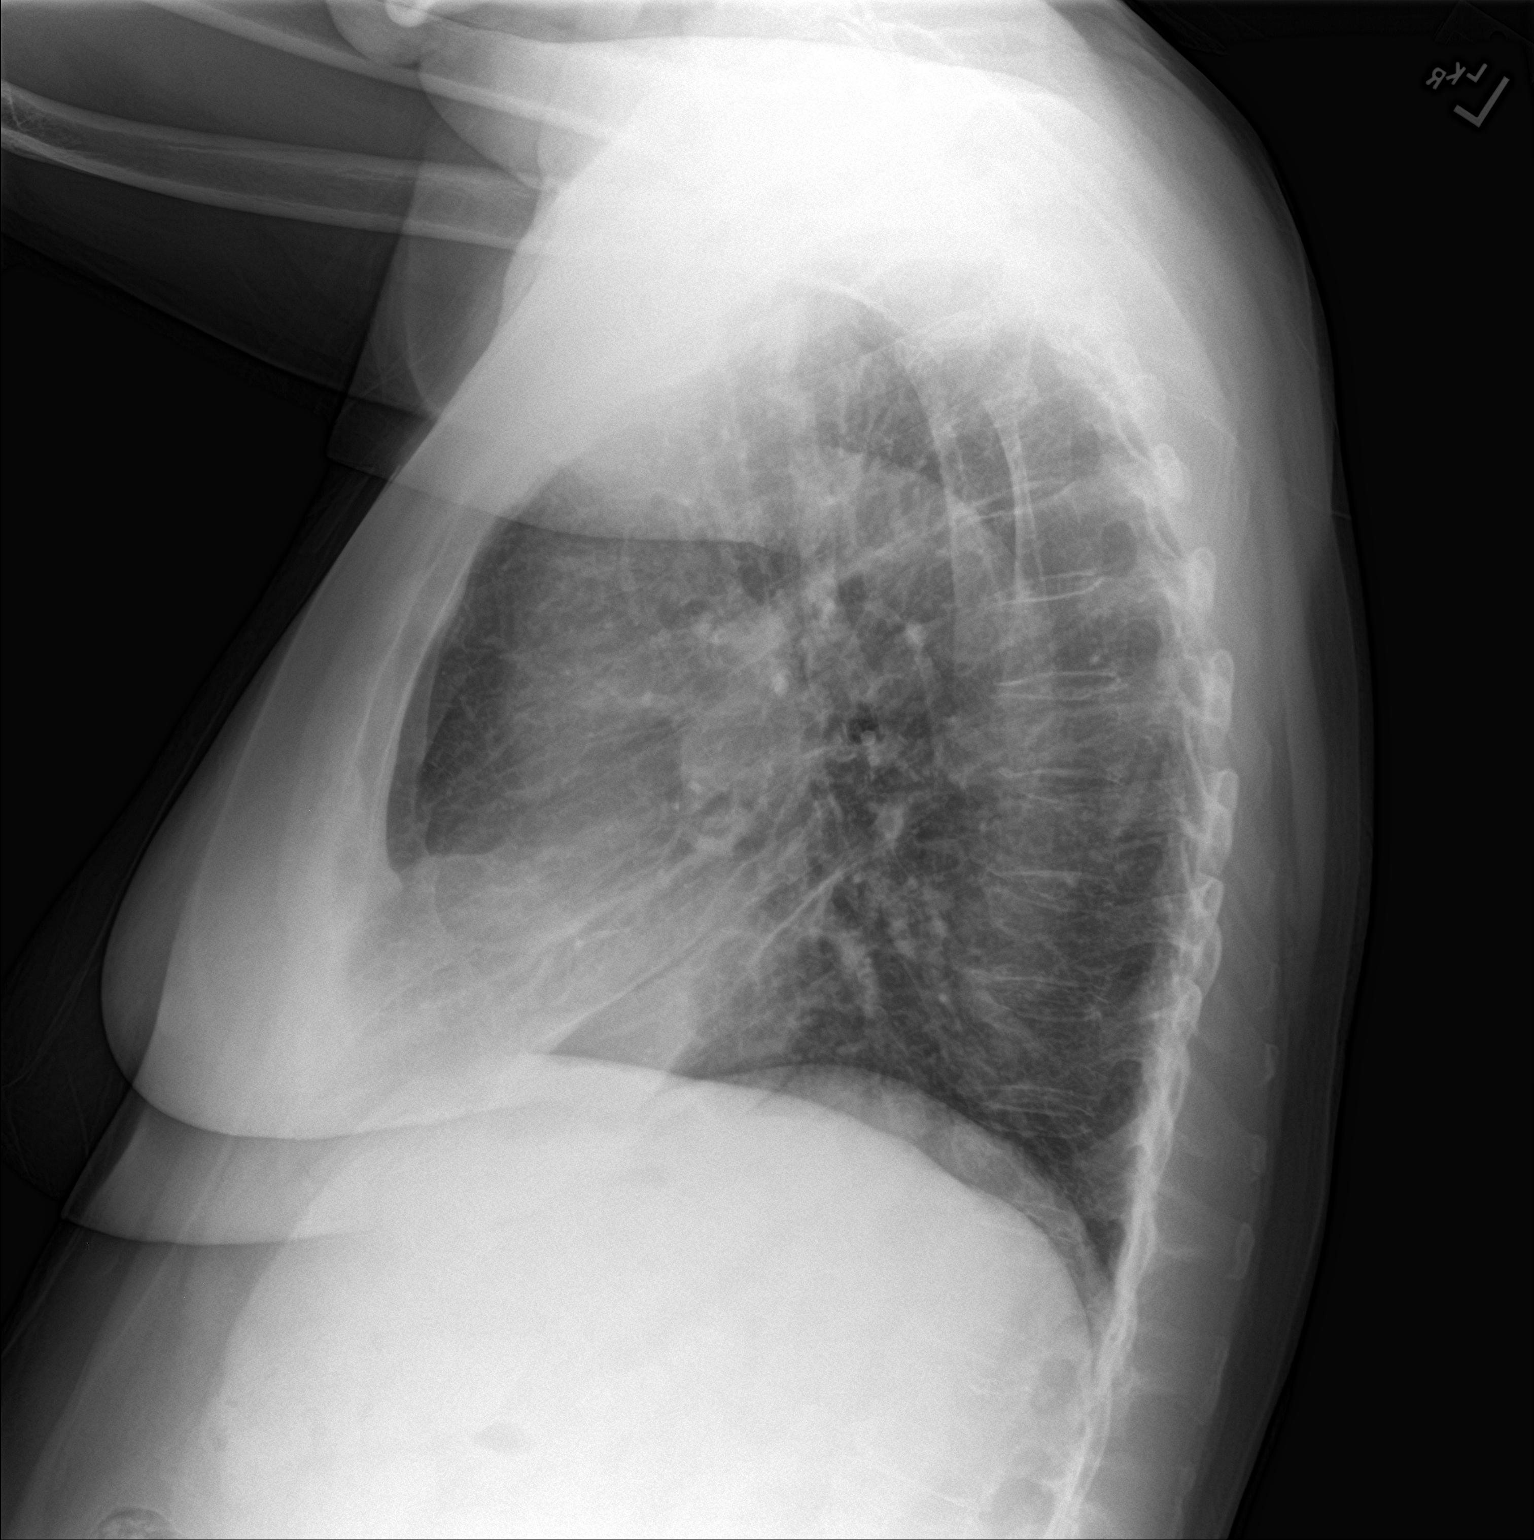

[2 of 2 positions shown; findings below may reference images not displayed]

FINDINGS: Upper normal heart size.

Hiatal hernia again identified.

Mediastinal contours and pulmonary vascularity otherwise normal.

Mild chronic bronchitic changes.

No acute infiltrate, pleural effusion or pneumothorax.

Old RIGHT rib fractures.

No acute bony abnormalities.
IMPRESSION: Mild chronic bronchitic changes.

Hiatal hernia.

No acute abnormalities.

## 2019-05-28 IMAGING — DX DG CHEST 1V PORT
1 series · 1 of 1 positions shown · non-contrast
Comparison: 08/26/2016

CLINICAL DATA: Fever, altered mental status

EXAM:
PORTABLE CHEST 1 VIEW

[chest ap]
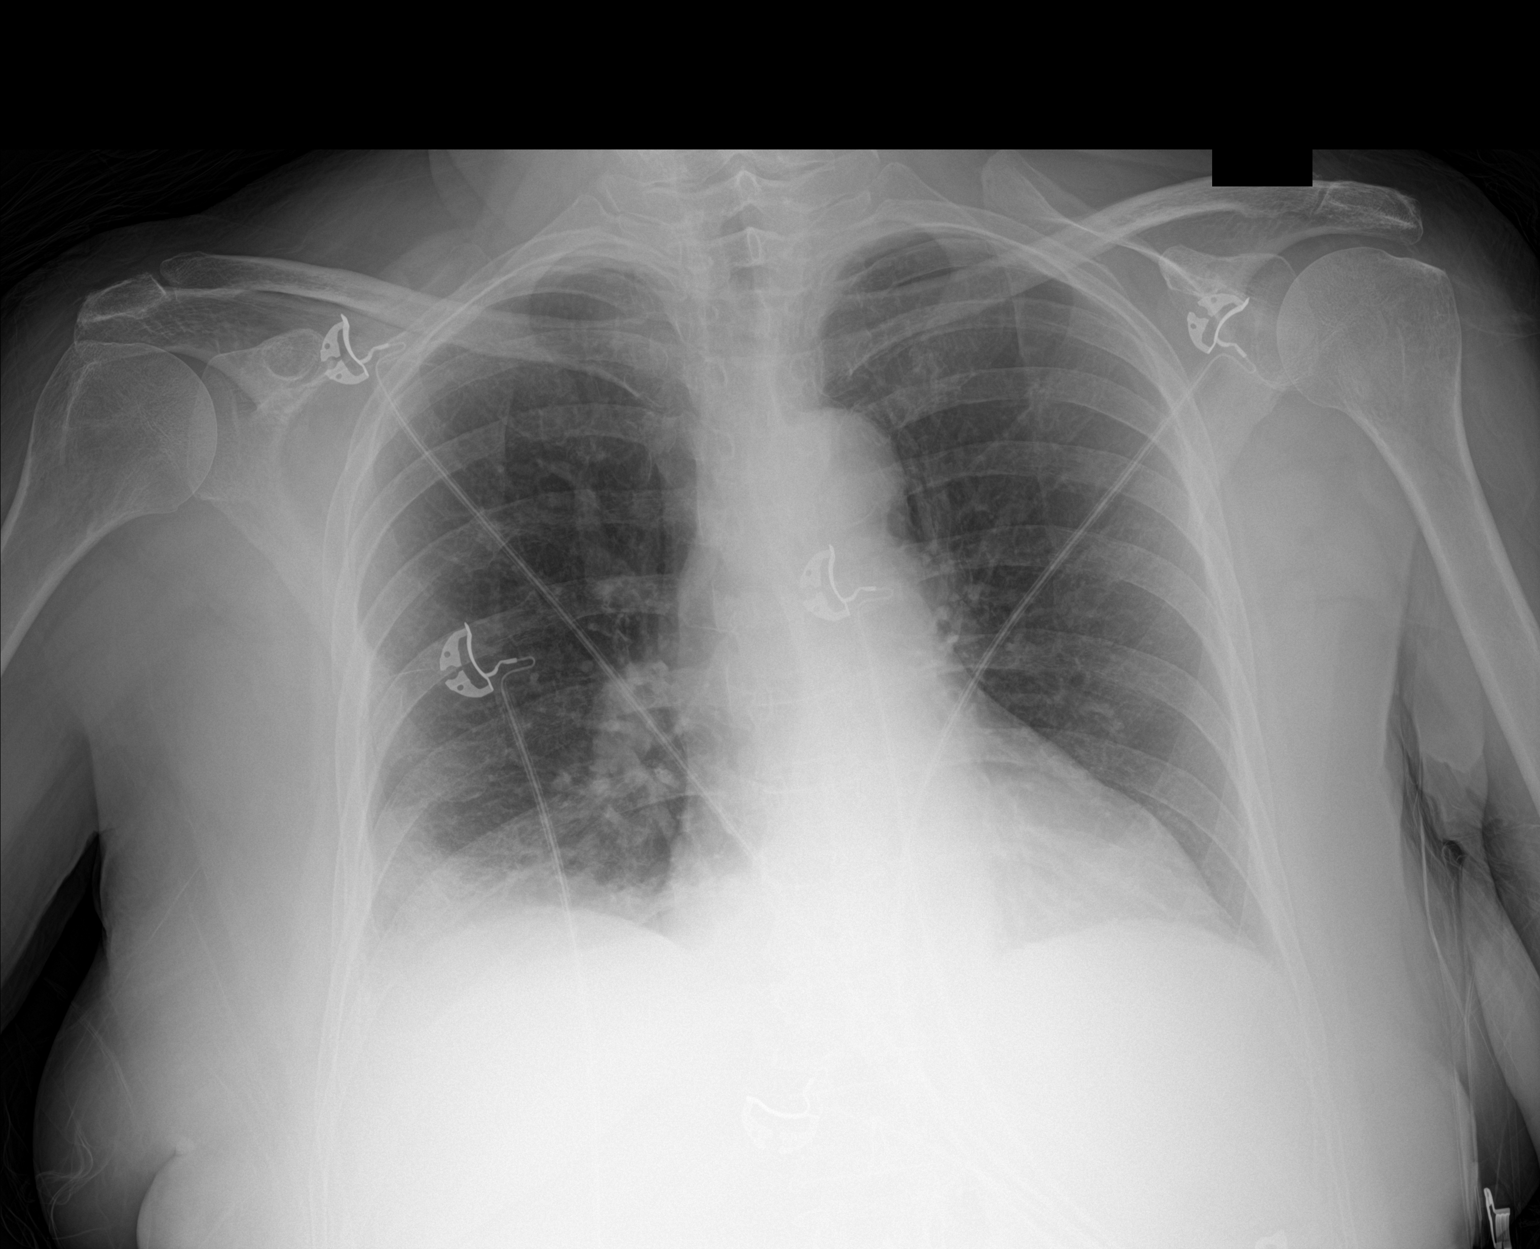

[1 of 1 positions shown; findings below may reference images not displayed]

FINDINGS: Mild right lower lobe opacity, pneumonia not excluded. Left lung is
clear. No pleural effusion or pneumothorax.

The heart is normal in size.
IMPRESSION: Mild right lower lobe opacity, pneumonia not excluded.
